# Patient Record
Sex: Female | Born: 1985
Health system: Southern US, Community
[De-identification: ages and names within clinical notes are randomized; demographics above are authoritative.]

## PROBLEM LIST (undated history)

## (undated) DIAGNOSIS — F419 Anxiety disorder, unspecified: Secondary | ICD-10-CM

## (undated) HISTORY — PX: DILATION AND CURETTAGE OF UTERUS: SHX78

---

## 2010-10-27 ENCOUNTER — Emergency Department (HOSPITAL_BASED_OUTPATIENT_CLINIC_OR_DEPARTMENT_OTHER)
Admission: EM | Admit: 2010-10-27 | Discharge: 2010-10-27 | Disposition: A | Discharge status: Home / Self Care | Payer: Self-pay | Attending: Emergency Medicine | Admitting: Emergency Medicine

## 2010-10-27 DIAGNOSIS — R300 Dysuria: Secondary | ICD-10-CM | POA: Insufficient documentation

## 2010-10-27 DIAGNOSIS — N390 Urinary tract infection, site not specified: Secondary | ICD-10-CM | POA: Insufficient documentation

## 2010-10-27 LAB — URINE MICROSCOPIC-ADD ON

## 2010-10-27 LAB — URINALYSIS, ROUTINE W REFLEX MICROSCOPIC
Nitrite: POSITIVE — AB
Protein, ur: NEGATIVE mg/dL
Specific Gravity, Urine: 1.006 (ref 1.005–1.030)
Urobilinogen, UA: 1 mg/dL (ref 0.0–1.0)

## 2010-10-29 LAB — URINE CULTURE: Colony Count: 70000

## 2011-05-24 ENCOUNTER — Emergency Department (INDEPENDENT_AMBULATORY_CARE_PROVIDER_SITE_OTHER): Payer: Self-pay

## 2011-05-24 ENCOUNTER — Encounter: Payer: Self-pay | Admitting: *Deleted

## 2011-05-24 ENCOUNTER — Emergency Department (HOSPITAL_BASED_OUTPATIENT_CLINIC_OR_DEPARTMENT_OTHER)
Admission: EM | Admit: 2011-05-24 | Discharge: 2011-05-25 | Disposition: A | Discharge status: Home / Self Care | Payer: Self-pay | Attending: Emergency Medicine | Admitting: Emergency Medicine

## 2011-05-24 DIAGNOSIS — B3731 Acute candidiasis of vulva and vagina: Secondary | ICD-10-CM | POA: Insufficient documentation

## 2011-05-24 DIAGNOSIS — K5289 Other specified noninfective gastroenteritis and colitis: Secondary | ICD-10-CM | POA: Insufficient documentation

## 2011-05-24 DIAGNOSIS — B373 Candidiasis of vulva and vagina: Secondary | ICD-10-CM

## 2011-05-24 DIAGNOSIS — K529 Noninfective gastroenteritis and colitis, unspecified: Secondary | ICD-10-CM

## 2011-05-24 DIAGNOSIS — F17200 Nicotine dependence, unspecified, uncomplicated: Secondary | ICD-10-CM | POA: Insufficient documentation

## 2011-05-24 DIAGNOSIS — R1013 Epigastric pain: Secondary | ICD-10-CM | POA: Insufficient documentation

## 2011-05-24 LAB — COMPREHENSIVE METABOLIC PANEL
AST: 21 U/L (ref 0–37)
Albumin: 3.9 g/dL (ref 3.5–5.2)
BUN: 9 mg/dL (ref 6–23)
Chloride: 106 mEq/L (ref 96–112)
Creatinine, Ser: 0.8 mg/dL (ref 0.50–1.10)
Potassium: 3.6 mEq/L (ref 3.5–5.1)
Total Bilirubin: 0.2 mg/dL — ABNORMAL LOW (ref 0.3–1.2)
Total Protein: 6.5 g/dL (ref 6.0–8.3)

## 2011-05-24 LAB — CBC
Hemoglobin: 13.7 g/dL (ref 12.0–15.0)
MCH: 31.9 pg (ref 26.0–34.0)
MCHC: 34.3 g/dL (ref 30.0–36.0)
RDW: 12.4 % (ref 11.5–15.5)

## 2011-05-24 LAB — URINALYSIS, ROUTINE W REFLEX MICROSCOPIC
Bilirubin Urine: NEGATIVE
Ketones, ur: NEGATIVE mg/dL
Leukocytes, UA: NEGATIVE
Nitrite: NEGATIVE
Protein, ur: NEGATIVE mg/dL

## 2011-05-24 LAB — DIFFERENTIAL
Basophils Absolute: 0 10*3/uL (ref 0.0–0.1)
Basophils Relative: 0 % (ref 0–1)
Eosinophils Absolute: 0.1 10*3/uL (ref 0.0–0.7)
Monocytes Absolute: 0.4 10*3/uL (ref 0.1–1.0)
Monocytes Relative: 7 % (ref 3–12)
Neutro Abs: 4.7 10*3/uL (ref 1.7–7.7)
Neutrophils Relative %: 70 % (ref 43–77)

## 2011-05-24 LAB — PREGNANCY, URINE: Preg Test, Ur: NEGATIVE

## 2011-05-24 LAB — LIPASE, BLOOD: Lipase: 38 U/L (ref 11–59)

## 2011-05-24 MED ORDER — ONDANSETRON HCL 4 MG/2ML IJ SOLN
4.0000 mg | Freq: Once | INTRAMUSCULAR | Status: AC
  Administered 2011-05-24: 4 mg via INTRAVENOUS
  Filled 2011-05-24: qty 2

## 2011-05-24 MED ORDER — SODIUM CHLORIDE 0.9 % IV SOLN
Freq: Once | INTRAVENOUS | Status: AC
  Administered 2011-05-24: 21:00:00 via INTRAVENOUS

## 2011-05-24 MED ORDER — HYDROMORPHONE HCL 1 MG/ML IJ SOLN
1.0000 mg | Freq: Once | INTRAMUSCULAR | Status: AC
  Administered 2011-05-24: 1 mg via INTRAVENOUS
  Filled 2011-05-24: qty 1

## 2011-05-24 NOTE — ED Notes (Addendum)
Pt reports constant dull pain in upper abd with occasional sharp pains. Pt reports decreased appetite on sat with abd painh starting on sun and diarrhea. Denies n/v. Pt also states she had abortion sept 9th and has not had period since.

## 2011-05-24 NOTE — ED Notes (Signed)
Pt c/o abd pain x 2 days , denies n/v/d

## 2011-05-24 NOTE — ED Provider Notes (Signed)
Medical screening examination/treatment/procedure(s) were performed by non-physician practitioner and as supervising physician I was immediately available for consultation/collaboration.   Lyanne Co, MD 05/24/11 [phone]

## 2011-05-24 NOTE — ED Provider Notes (Addendum)
History     CSN: [id number] Arrival date & time: 05/24/2011  7:57 PM  Chief Complaint  Patient presents with  . Abdominal Pain    (Consider location/radiation/quality/duration/timing/severity/associated sxs/prior treatment) Patient is a 25 y.o. female presenting with abdominal pain. The history is provided by the patient. No language interpreter was used.  Abdominal Pain The primary symptoms of the illness include abdominal pain. The current episode started 2 days ago. The onset of the illness was gradual. The problem has been gradually worsening.  The patient states that she believes she is currently not pregnant. The patient has not had a change in bowel habit. Additional symptoms associated with the illness include back pain. Significant associated medical issues include PUD and GERD.  Pt complains of right and left upper abdominal pain.  Pt has a heating pad that she reports helps with the pain.  History reviewed. No pertinent past medical history.  Past Surgical History  Procedure Date  . Dilation and curettage of uterus     History reviewed. No pertinent family history.  History  Substance Use Topics  . Smoking status: Current Everyday Smoker -- 0.5 packs/day  . Smokeless tobacco: Not on file  . Alcohol Use: No    OB History    Grav Para Term Preterm Abortions TAB SAB Ect Mult Living                  Review of Systems  Gastrointestinal: Positive for abdominal pain.  Musculoskeletal: Positive for back pain.  All other systems reviewed and are negative.    Allergies  Amoxicillin and Penicillins cross reactors  Home Medications   Current Outpatient Rx  Name Route Sig Dispense Refill  . IBUPROFEN 200 MG PO TABS Oral Take 1,200 mg by mouth every 6 (six) hours as needed. For pain     . RANITIDINE HCL 150 MG PO TABS Oral Take 150 mg by mouth 2 (two) times daily.        BP 135/81  Pulse 104  Temp(Src) 98.7 F (37.1 C) (Oral)  Resp 16  Ht 5\' 2"  (1.575 m)   Wt 120 lb (54.432 kg)  BMI 21.95 kg/m2  SpO2 100%  LMP 03/25/2011  Physical Exam  Nursing note and vitals reviewed. Constitutional: She is oriented to person, place, and time. She appears well-developed and well-nourished.  HENT:  Head: Normocephalic and atraumatic.  Eyes: Conjunctivae are normal. Pupils are equal, round, and reactive to light.  Neck: Normal range of motion.  Cardiovascular: Normal rate.   Pulmonary/Chest: Effort normal.  Abdominal: Soft. There is tenderness.  Genitourinary: Vaginal discharge found.       Thick white discharge looks like yeast,  Adnexa and cervix nontender  Musculoskeletal: Normal range of motion.  Neurological: She is alert and oriented to person, place, and time.  Skin: Skin is warm.  Psychiatric: She has a normal mood and affect.    ED Course  Procedures (including critical care time)   Labs Reviewed  PREGNANCY, URINE  URINALYSIS, ROUTINE W REFLEX MICROSCOPIC  CBC  DIFFERENTIAL  COMPREHENSIVE METABOLIC PANEL  LIPASE, BLOOD   No results found.   No diagnosis found.    MDM   Pt counseled on results,  Pt given rx for yeast and rx for pain      Langston Masker, [place] 05/24/11 2337  Langston Masker, [place] 05/29/11 1719

## 2011-05-24 NOTE — ED Notes (Signed)
Pt assisted to bathroom. Pt denies pain at this time. Pt awaiting ct scan. Pt made aware of POC.

## 2011-05-24 NOTE — ED Notes (Signed)
Abortion x 9 weeks ago

## 2011-05-25 DIAGNOSIS — R109 Unspecified abdominal pain: Secondary | ICD-10-CM

## 2011-05-25 LAB — WET PREP, GENITAL
Clue Cells Wet Prep HPF POC: NONE SEEN
Trich, Wet Prep: NONE SEEN

## 2011-05-25 MED ORDER — METRONIDAZOLE 500 MG PO TABS
500.0000 mg | ORAL_TABLET | Freq: Once | ORAL | Status: AC
  Administered 2011-05-25: 500 mg via ORAL
  Filled 2011-05-25: qty 1

## 2011-05-25 MED ORDER — CIPROFLOXACIN HCL 500 MG PO TABS: 500.0000 mg | ORAL_TABLET | Freq: Two times a day (BID) | ORAL | Status: DC

## 2011-05-25 MED ORDER — HYDROCODONE-ACETAMINOPHEN 5-325 MG PO TABS: 2.0000 | ORAL_TABLET | ORAL | Status: AC | PRN

## 2011-05-25 MED ORDER — CIPROFLOXACIN HCL 500 MG PO TABS: 500.0000 mg | ORAL_TABLET | Freq: Two times a day (BID) | ORAL | Status: AC

## 2011-05-25 MED ORDER — IOHEXOL 300 MG/ML  SOLN
100.0000 mL | Freq: Once | INTRAMUSCULAR | Status: AC | PRN
  Administered 2011-05-25: 100 mL via INTRAVENOUS

## 2011-05-25 MED ORDER — FLUCONAZOLE 200 MG PO TABS: 150.0000 mg | ORAL_TABLET | Freq: Every day | ORAL | Status: AC

## 2011-05-25 MED ORDER — CIPROFLOXACIN HCL 500 MG PO TABS
500.0000 mg | ORAL_TABLET | Freq: Once | ORAL | Status: AC
  Administered 2011-05-25: 500 mg via ORAL
  Filled 2011-05-25: qty 1

## 2011-05-25 MED ORDER — METRONIDAZOLE 500 MG PO TABS: 500.0000 mg | ORAL_TABLET | Freq: Two times a day (BID) | ORAL | Status: AC

## 2011-05-25 MED ORDER — OMEPRAZOLE 20 MG PO CPDR: 20.0000 mg | DELAYED_RELEASE_CAPSULE | Freq: Every day | ORAL | Status: DC

## 2011-05-26 LAB — GC/CHLAMYDIA PROBE AMP, GENITAL: GC Probe Amp, Genital: NEGATIVE

## 2011-06-07 NOTE — ED Provider Notes (Signed)
Medical screening examination/treatment/procedure(s) were performed by non-physician practitioner and as supervising physician I was immediately available for consultation/collaboration.   Lyanne Co, MD 06/07/11 [phone]

## 2011-09-07 ENCOUNTER — Emergency Department (INDEPENDENT_AMBULATORY_CARE_PROVIDER_SITE_OTHER): Payer: Self-pay

## 2011-09-07 ENCOUNTER — Encounter (HOSPITAL_BASED_OUTPATIENT_CLINIC_OR_DEPARTMENT_OTHER): Payer: Self-pay

## 2011-09-07 ENCOUNTER — Emergency Department (HOSPITAL_BASED_OUTPATIENT_CLINIC_OR_DEPARTMENT_OTHER)
Admission: EM | Admit: 2011-09-07 | Discharge: 2011-09-07 | Disposition: A | Discharge status: Home / Self Care | Payer: Self-pay | Attending: Emergency Medicine | Admitting: Emergency Medicine

## 2011-09-07 DIAGNOSIS — R109 Unspecified abdominal pain: Secondary | ICD-10-CM

## 2011-09-07 DIAGNOSIS — N912 Amenorrhea, unspecified: Secondary | ICD-10-CM | POA: Insufficient documentation

## 2011-09-07 DIAGNOSIS — G43909 Migraine, unspecified, not intractable, without status migrainosus: Secondary | ICD-10-CM | POA: Insufficient documentation

## 2011-09-07 LAB — URINALYSIS, ROUTINE W REFLEX MICROSCOPIC
Bilirubin Urine: NEGATIVE
Glucose, UA: NEGATIVE mg/dL
Hgb urine dipstick: NEGATIVE
Ketones, ur: NEGATIVE mg/dL
Protein, ur: NEGATIVE mg/dL
Urobilinogen, UA: 0.2 mg/dL (ref 0.0–1.0)

## 2011-09-07 LAB — WET PREP, GENITAL
Trich, Wet Prep: NONE SEEN
Yeast Wet Prep HPF POC: NONE SEEN

## 2011-09-07 LAB — PREGNANCY, URINE: Preg Test, Ur: NEGATIVE

## 2011-09-07 MED ORDER — HYDROCODONE-ACETAMINOPHEN 5-500 MG PO TABS: 1.0000 | ORAL_TABLET | Freq: Four times a day (QID) | ORAL | Status: AC | PRN

## 2011-09-07 NOTE — ED Notes (Signed)
Pt c/o no menstrual cycle since abortion on 03/25/11.  Pt states she has intermittent pressure in lower abdomen.  Pt denies N/V.  Pt states sometimes pain increases after eating.

## 2011-09-07 NOTE — ED Provider Notes (Signed)
History     CSN: [id number]  Arrival date & time 09/07/11  1710   First MD Initiated Contact with Patient 09/07/11 1733      Chief Complaint  Patient presents with  . Amenorrhea    (Consider location/radiation/quality/duration/timing/severity/associated sxs/prior treatment) HPI Comments: Pt states that she hasn't had a period since 03/25/11 when she had an abortion:pt state that she gets the pain every couple of days and it lasts a couple of days:pt states that she seems to be getting more migraines with all this as well and she treats with excedrin  Patient is a 26 y.o. female presenting with abdominal pain. The history is provided by the patient. No language interpreter was used.  Abdominal Pain The primary symptoms of the illness include abdominal pain. The primary symptoms of the illness do not include fever, nausea, vomiting, dysuria, vaginal discharge or vaginal bleeding. The current episode started more than 2 days ago. The onset of the illness was gradual. The problem has not changed since onset. The patient states that she believes she is currently not pregnant. The patient has not had a change in bowel habit. Symptoms associated with the illness do not include constipation, urgency, hematuria or frequency.    History reviewed. No pertinent past medical history.  Past Surgical History  Procedure Date  . Dilation and curettage of uterus     No family history on file.  History  Substance Use Topics  . Smoking status: Current Everyday Smoker -- 0.5 packs/day  . Smokeless tobacco: Not on file  . Alcohol Use: No    OB History    Grav Para Term Preterm Abortions TAB SAB Ect Mult Living                  Review of Systems  Constitutional: Negative for fever.  Gastrointestinal: Positive for abdominal pain. Negative for nausea, vomiting and constipation.  Genitourinary: Negative for dysuria, urgency, frequency, hematuria, vaginal bleeding and vaginal discharge.  All  other systems reviewed and are negative.    Allergies  Amoxicillin and Penicillins cross reactors  Home Medications   Current Outpatient Rx  Name Route Sig Dispense Refill  . ASPIRIN-ACETAMINOPHEN-CAFFEINE 250-250-65 MG PO TABS Oral Take 3 tablets by mouth every 6 (six) hours as needed. For pain    . ASPIRIN-ACETAMINOPHEN-CAFFEINE 500-325-65 MG PO PACK Oral Take 1 packet by mouth every 6 (six) hours as needed. For pain    . IBUPROFEN 200 MG PO TABS Oral Take 1,200 mg by mouth every 6 (six) hours as needed. For pain      BP 120/84  Pulse 98  Temp(Src) 98.4 F (36.9 C) (Oral)  Resp 16  Ht 5\' 3"  (1.6 m)  Wt 120 lb (54.432 kg)  BMI 21.26 kg/m2  SpO2 100%  LMP 01/10/2011  Physical Exam  Nursing note and vitals reviewed. Constitutional: She is oriented to person, place, and time. She appears well-developed and well-nourished.  HENT:  Head: Normocephalic and atraumatic.  Cardiovascular: Normal rate and regular rhythm.   Pulmonary/Chest: Effort normal and breath sounds normal.  Abdominal: Soft. Bowel sounds are normal. There is no tenderness.  Genitourinary: Cervix exhibits motion tenderness. Vaginal discharge found.  Musculoskeletal: Normal range of motion.  Neurological: She is alert and oriented to person, place, and time.  Skin: Skin is warm and dry.  Psychiatric: She has a normal mood and affect.    ED Course  Procedures (including critical care time)  Labs Reviewed  WET PREP, GENITAL - Abnormal;  Notable for the following:    Clue Cells, Wet Prep FEW (*)    WBC, Wet Prep HPF POC FEW (*)    All other components within normal limits  URINALYSIS, ROUTINE W REFLEX MICROSCOPIC  PREGNANCY, URINE  GC/CHLAMYDIA PROBE AMP, GENITAL   US Transvaginal Non-ob  09/07/2011  *RADIOLOGY REPORT*  Clinical Data: Abdominal pain.  Pressure.  No menses since [facility] in August 2012.  LMP 03/25/2011.  TRANSABDOMINAL AND TRANSVAGINAL ULTRASOUND OF PELVIS Technique:  Both transabdominal and  transvaginal ultrasound examinations of the pelvis were performed. Transabdominal technique was performed for global imaging of the pelvis including uterus, ovaries, adnexal regions, and pelvic cul-de-sac.  Comparison: CT 05/25/2011   It was necessary to proceed with endovaginal exam following the transabdominal exam to visualize the uterus and ovaries.  Findings:  Uterus: The uterus is 5.8 x 3.0 x 4.2 cm.  Endometrium: The endometrium is normal in appearance, 4.9 mm.  Right ovary:  Normal appearance/no adnexal mass  Left ovary: Normal appearance/no adnexal mass  Other findings: No free fluid  IMPRESSION: Normal study. No evidence of pelvic mass or other significant abnormality.  Original Report Authenticated By: Patterson Hammersmith, M.D.   US Pelvis Complete  09/07/2011  *RADIOLOGY REPORT*  Clinical Data: Abdominal pain.  Pressure.  No menses since [facility] in August 2012.  LMP 03/25/2011.  TRANSABDOMINAL AND TRANSVAGINAL ULTRASOUND OF PELVIS Technique:  Both transabdominal and transvaginal ultrasound examinations of the pelvis were performed. Transabdominal technique was performed for global imaging of the pelvis including uterus, ovaries, adnexal regions, and pelvic cul-de-sac.  Comparison: CT 05/25/2011   It was necessary to proceed with endovaginal exam following the transabdominal exam to visualize the uterus and ovaries.  Findings:  Uterus: The uterus is 5.8 x 3.0 x 4.2 cm.  Endometrium: The endometrium is normal in appearance, 4.9 mm.  Right ovary:  Normal appearance/no adnexal mass  Left ovary: Normal appearance/no adnexal mass  Other findings: No free fluid  IMPRESSION: Normal study. No evidence of pelvic mass or other significant abnormality.  Original Report Authenticated By: Patterson Hammersmith, M.D.     1. Amenorrhea   2. Abdominal pain       MDM  No reason noted for pts symptoms:pt is okay to follow up for continued symptom        Teressa Lower, NP 09/07/11 1944

## 2011-09-08 NOTE — ED Provider Notes (Signed)
Medical screening examination/treatment/procedure(s) were performed by non-physician practitioner and as supervising physician I was immediately available for consultation/collaboration.   Landin Tallon A. Keigo Whalley, MD 09/08/11 0007 

## 2012-07-05 ENCOUNTER — Encounter (HOSPITAL_BASED_OUTPATIENT_CLINIC_OR_DEPARTMENT_OTHER): Payer: Self-pay | Admitting: *Deleted

## 2012-07-05 ENCOUNTER — Emergency Department (HOSPITAL_BASED_OUTPATIENT_CLINIC_OR_DEPARTMENT_OTHER)
Admission: EM | Admit: 2012-07-05 | Discharge: 2012-07-05 | Disposition: A | Discharge status: Home / Self Care | Payer: Self-pay | Attending: Emergency Medicine | Admitting: Emergency Medicine

## 2012-07-05 DIAGNOSIS — R509 Fever, unspecified: Secondary | ICD-10-CM | POA: Insufficient documentation

## 2012-07-05 DIAGNOSIS — F17200 Nicotine dependence, unspecified, uncomplicated: Secondary | ICD-10-CM | POA: Insufficient documentation

## 2012-07-05 DIAGNOSIS — R059 Cough, unspecified: Secondary | ICD-10-CM | POA: Insufficient documentation

## 2012-07-05 DIAGNOSIS — R05 Cough: Secondary | ICD-10-CM | POA: Insufficient documentation

## 2012-07-05 DIAGNOSIS — J329 Chronic sinusitis, unspecified: Secondary | ICD-10-CM | POA: Insufficient documentation

## 2012-07-05 MED ORDER — SULFAMETHOXAZOLE-TRIMETHOPRIM 800-160 MG PO TABS: 1.0000 | ORAL_TABLET | Freq: Two times a day (BID) | ORAL | Status: DC

## 2012-07-05 MED ORDER — HYDROCODONE-ACETAMINOPHEN 5-325 MG PO TABS: 2.0000 | ORAL_TABLET | ORAL | Status: DC | PRN

## 2012-07-05 NOTE — ED Notes (Signed)
PA at bedside.

## 2012-07-05 NOTE — ED Provider Notes (Signed)
Medical screening examination/treatment/procedure(s) were performed by non-physician practitioner and as supervising physician I was immediately available for consultation/collaboration.   Charles B. Sheldon, MD 07/05/12 2037 

## 2012-07-05 NOTE — ED Notes (Signed)
Pt c/o " h/a all day"  

## 2012-07-05 NOTE — ED Provider Notes (Signed)
History     CSN: [id number]  Arrival date & time 07/05/12  4098   First MD Initiated Contact with Patient 07/05/12 1933      Chief Complaint  Patient presents with  . Headache    (Consider location/radiation/quality/duration/timing/severity/associated sxs/prior treatment) Patient is a 26 y.o. female presenting with headaches. The history is provided by the patient. No language interpreter was used.  Headache  This is a new problem. The current episode started 12 to 24 hours ago. The problem occurs constantly. The problem has been gradually worsening. Associated with: cough. The pain is located in the bilateral region. The quality of the pain is described as sharp. The pain is at a severity of 6/10. The pain does not radiate. Associated symptoms include a fever. Pertinent negatives include no nausea and no vomiting. She has tried NSAIDs for the symptoms.    History reviewed. No pertinent past medical history.  Past Surgical History  Procedure Date  . Dilation and curettage of uterus     History reviewed. No pertinent family history.  History  Substance Use Topics  . Smoking status: Current Every Day Smoker -- 0.5 packs/day  . Smokeless tobacco: Not on file  . Alcohol Use: No    OB History    Grav Para Term Preterm Abortions TAB SAB Ect Mult Living                  Review of Systems  Constitutional: Positive for fever.  Gastrointestinal: Negative for nausea and vomiting.  Neurological: Positive for headaches.  All other systems reviewed and are negative.    Allergies  Amoxicillin and Penicillins cross reactors  Home Medications   Current Outpatient Rx  Name  Route  Sig  Dispense  Refill  . ASPIRIN-ACETAMINOPHEN-CAFFEINE 250-250-65 MG PO TABS   Oral   Take 3 tablets by mouth every 6 (six) hours as needed. For pain         . ASPIRIN-ACETAMINOPHEN-CAFFEINE 500-325-65 MG PO PACK   Oral   Take 1 packet by mouth every 6 (six) hours as needed. For pain        . IBUPROFEN 200 MG PO TABS   Oral   Take 1,200 mg by mouth every 6 (six) hours as needed. For pain           BP 100/78  Pulse 75  Temp 98 F (36.7 C) (Oral)  Resp 16  Ht 5\' 2"  (1.575 m)  Wt 130 lb (58.968 kg)  BMI 23.78 kg/m2  SpO2 100%  LMP 07/05/2012  Physical Exam  Nursing note and vitals reviewed. Constitutional: She appears well-developed and well-nourished.  HENT:  Head: Normocephalic and atraumatic.  Right Ear: External ear normal.  Left Ear: External ear normal.  Nose: Nose normal.  Mouth/Throat: Oropharynx is clear and moist.       Tender bilat maxillary sinuses and frontal sinuses  Eyes: Conjunctivae normal and EOM are normal. Pupils are equal, round, and reactive to light.  Neck: Normal range of motion. Neck supple.  Cardiovascular: Normal rate and normal heart sounds.   Pulmonary/Chest: Effort normal.  Abdominal: Soft.  Musculoskeletal: Normal range of motion.  Neurological: She is alert.  Skin: Skin is warm.  Psychiatric: She has a normal mood and affect.    ED Course  Procedures (including critical care time)  Labs Reviewed - No data to display No results found.   No diagnosis found.    MDM  Hydrocodone #10 Bactrim DS  Lonia Skinner [place], [place] 07/05/12 2002

## 2012-08-15 ENCOUNTER — Encounter (HOSPITAL_BASED_OUTPATIENT_CLINIC_OR_DEPARTMENT_OTHER): Payer: Self-pay | Admitting: *Deleted

## 2012-08-15 ENCOUNTER — Emergency Department (HOSPITAL_BASED_OUTPATIENT_CLINIC_OR_DEPARTMENT_OTHER)
Admission: EM | Admit: 2012-08-15 | Discharge: 2012-08-15 | Disposition: A | Discharge status: Home / Self Care | Payer: Self-pay | Attending: Emergency Medicine | Admitting: Emergency Medicine

## 2012-08-15 DIAGNOSIS — N390 Urinary tract infection, site not specified: Secondary | ICD-10-CM | POA: Insufficient documentation

## 2012-08-15 DIAGNOSIS — Z79899 Other long term (current) drug therapy: Secondary | ICD-10-CM | POA: Insufficient documentation

## 2012-08-15 DIAGNOSIS — R110 Nausea: Secondary | ICD-10-CM | POA: Insufficient documentation

## 2012-08-15 DIAGNOSIS — Z87440 Personal history of urinary (tract) infections: Secondary | ICD-10-CM | POA: Insufficient documentation

## 2012-08-15 DIAGNOSIS — R350 Frequency of micturition: Secondary | ICD-10-CM | POA: Insufficient documentation

## 2012-08-15 DIAGNOSIS — F17200 Nicotine dependence, unspecified, uncomplicated: Secondary | ICD-10-CM | POA: Insufficient documentation

## 2012-08-15 LAB — URINALYSIS, ROUTINE W REFLEX MICROSCOPIC
Hgb urine dipstick: NEGATIVE
Ketones, ur: 15 mg/dL — AB
Specific Gravity, Urine: 1.023 (ref 1.005–1.030)
Urobilinogen, UA: 4 mg/dL — ABNORMAL HIGH (ref 0.0–1.0)
pH: 5 (ref 5.0–8.0)

## 2012-08-15 LAB — URINE MICROSCOPIC-ADD ON

## 2012-08-15 MED ORDER — HYDROCODONE-ACETAMINOPHEN 5-325 MG PO TABS: 2.0000 | ORAL_TABLET | ORAL | Status: DC | PRN

## 2012-08-15 MED ORDER — SULFAMETHOXAZOLE-TRIMETHOPRIM 800-160 MG PO TABS: 1.0000 | ORAL_TABLET | Freq: Two times a day (BID) | ORAL | Status: DC

## 2012-08-15 NOTE — ED Provider Notes (Signed)
History     CSN: [id number]  Arrival date & time 08/15/12  1517   First MD Initiated Contact with Patient 08/15/12 1656      Chief Complaint  Patient presents with  . Urinary Tract Infection    (Consider location/radiation/quality/duration/timing/severity/associated sxs/prior treatment) Patient is a 26 y.o. female presenting with dysuria. The history is provided by the patient. No language interpreter was used.  Dysuria  This is a new problem. The current episode started yesterday. The problem occurs every urination. The problem has been gradually worsening. The quality of the pain is described as burning. The pain is at a severity of 7/10. The pain is moderate. There has been no fever. Associated symptoms include nausea and frequency. Pertinent negatives include no chills. Treatments tried: azo. Her past medical history is significant for recurrent UTIs.    History reviewed. No pertinent past medical history.  Past Surgical History  Procedure Date  . Dilation and curettage of uterus     History reviewed. No pertinent family history.  History  Substance Use Topics  . Smoking status: Current Every Day Smoker -- 0.5 packs/day  . Smokeless tobacco: Not on file  . Alcohol Use: No    OB History    Grav Para Term Preterm Abortions TAB SAB Ect Mult Living                  Review of Systems  Constitutional: Negative for chills.  Gastrointestinal: Positive for nausea.  Genitourinary: Positive for dysuria and frequency.  All other systems reviewed and are negative.    Allergies  Amoxicillin and Penicillins cross reactors  Home Medications   Current Outpatient Rx  Name  Route  Sig  Dispense  Refill  . ASPIRIN-ACETAMINOPHEN-CAFFEINE 250-250-65 MG PO TABS   Oral   Take 3 tablets by mouth every 6 (six) hours as needed. For pain         . ASPIRIN-ACETAMINOPHEN-CAFFEINE 500-325-65 MG PO PACK   Oral   Take 1 packet by mouth every 6 (six) hours as needed. For pain           . HYDROCODONE-ACETAMINOPHEN 5-325 MG PO TABS   Oral   Take 2 tablets by mouth every 4 (four) hours as needed for pain.   10 tablet   0   . IBUPROFEN 200 MG PO TABS   Oral   Take 1,200 mg by mouth every 6 (six) hours as needed. For pain         . SULFAMETHOXAZOLE-TRIMETHOPRIM 800-160 MG PO TABS   Oral   Take 1 tablet by mouth every 12 (twelve) hours.   14 tablet   0     BP 110/69  Pulse 87  Temp 98.5 F (36.9 C) (Oral)  Resp 16  SpO2 99%  LMP 08/08/2012  Physical Exam  Nursing note and vitals reviewed. Constitutional: She appears well-developed and well-nourished.  HENT:  Head: Normocephalic.  Right Ear: External ear normal.  Left Ear: External ear normal.  Eyes: Conjunctivae normal are normal. Pupils are equal, round, and reactive to light.  Neck: Normal range of motion. Neck supple.  Cardiovascular: Normal rate.   Pulmonary/Chest: Effort normal.  Abdominal: Soft.  Musculoskeletal: Normal range of motion.  Neurological: She is alert.  Skin: Skin is warm.    ED Course  Procedures (including critical care time)  Labs Reviewed  URINALYSIS, ROUTINE W REFLEX MICROSCOPIC - Abnormal; Notable for the following:    Color, Urine RED (*)  BIOCHEMICALS MAY BE AFFECTED  BY COLOR   APPearance CLOUDY (*)     Bilirubin Urine SMALL (*)     Ketones, ur 15 (*)     Urobilinogen, UA 4.0 (*)     Nitrite POSITIVE (*)     Leukocytes, UA MODERATE (*)     All other components within normal limits  URINE MICROSCOPIC-ADD ON - Abnormal; Notable for the following:    Squamous Epithelial / LPF FEW (*)     Bacteria, UA FEW (*)     All other components within normal limits  URINE CULTURE   No results found.   No diagnosis found.    MDM  Pt given rx for bactrim and hydrocodone        Lonia Skinner [place], [place] 08/15/12 1704

## 2012-08-15 NOTE — ED Notes (Signed)
Pt with hx of frequent UTI states that last night began having sx of frequent burning urination states that she is taking OTC azo for sx. Denies fever has had nausea

## 2012-08-16 NOTE — ED Provider Notes (Signed)
History/physical exam/procedure(s) were performed by non-physician practitioner and as supervising physician I was immediately available for consultation/collaboration. I have reviewed all notes and am in agreement with care and plan.   Hilario Quarry, MD 08/16/12 [phone]

## 2012-08-17 LAB — URINE CULTURE: Colony Count: 15000

## 2012-08-18 NOTE — ED Notes (Signed)
+  urine Patient treated with Septra-sensitive to same-chart appended per protocol MD. 

## 2013-02-02 ENCOUNTER — Emergency Department (HOSPITAL_BASED_OUTPATIENT_CLINIC_OR_DEPARTMENT_OTHER)
Admission: EM | Admit: 2013-02-02 | Discharge: 2013-02-02 | Disposition: A | Discharge status: Home / Self Care | Payer: No Typology Code available for payment source | Attending: Emergency Medicine | Admitting: Emergency Medicine

## 2013-02-02 ENCOUNTER — Encounter (HOSPITAL_BASED_OUTPATIENT_CLINIC_OR_DEPARTMENT_OTHER): Payer: Self-pay | Admitting: *Deleted

## 2013-02-02 DIAGNOSIS — S0990XA Unspecified injury of head, initial encounter: Secondary | ICD-10-CM | POA: Insufficient documentation

## 2013-02-02 DIAGNOSIS — F17200 Nicotine dependence, unspecified, uncomplicated: Secondary | ICD-10-CM | POA: Insufficient documentation

## 2013-02-02 DIAGNOSIS — Z880 Allergy status to penicillin: Secondary | ICD-10-CM | POA: Insufficient documentation

## 2013-02-02 DIAGNOSIS — S161XXA Strain of muscle, fascia and tendon at neck level, initial encounter: Secondary | ICD-10-CM

## 2013-02-02 DIAGNOSIS — Z792 Long term (current) use of antibiotics: Secondary | ICD-10-CM | POA: Insufficient documentation

## 2013-02-02 DIAGNOSIS — Y92410 Unspecified street and highway as the place of occurrence of the external cause: Secondary | ICD-10-CM | POA: Insufficient documentation

## 2013-02-02 DIAGNOSIS — Y9389 Activity, other specified: Secondary | ICD-10-CM | POA: Insufficient documentation

## 2013-02-02 DIAGNOSIS — R55 Syncope and collapse: Secondary | ICD-10-CM | POA: Insufficient documentation

## 2013-02-02 DIAGNOSIS — S139XXA Sprain of joints and ligaments of unspecified parts of neck, initial encounter: Secondary | ICD-10-CM | POA: Insufficient documentation

## 2013-02-02 DIAGNOSIS — IMO0002 Reserved for concepts with insufficient information to code with codable children: Secondary | ICD-10-CM | POA: Insufficient documentation

## 2013-02-02 MED ORDER — METHOCARBAMOL 500 MG PO TABS
500.0000 mg | ORAL_TABLET | Freq: Once | ORAL | Status: AC
  Administered 2013-02-02: 500 mg via ORAL
  Filled 2013-02-02: qty 1

## 2013-02-02 MED ORDER — IBUPROFEN 600 MG PO TABS: 600.0000 mg | ORAL_TABLET | Freq: Four times a day (QID) | ORAL | Status: DC | PRN

## 2013-02-02 MED ORDER — TRAMADOL HCL 50 MG PO TABS: 50.0000 mg | ORAL_TABLET | Freq: Four times a day (QID) | ORAL | Status: DC | PRN

## 2013-02-02 MED ORDER — METHOCARBAMOL 500 MG PO TABS: 500.0000 mg | ORAL_TABLET | Freq: Two times a day (BID) | ORAL | Status: DC

## 2013-02-02 MED ORDER — KETOROLAC TROMETHAMINE 60 MG/2ML IM SOLN
60.0000 mg | Freq: Once | INTRAMUSCULAR | Status: AC
  Administered 2013-02-02: 60 mg via INTRAMUSCULAR
  Filled 2013-02-02: qty 2

## 2013-02-02 MED ORDER — TRAMADOL HCL 50 MG PO TABS
50.0000 mg | ORAL_TABLET | Freq: Once | ORAL | Status: AC
  Administered 2013-02-02: 50 mg via ORAL
  Filled 2013-02-02: qty 1
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
## 2013-03-21 ENCOUNTER — Emergency Department (HOSPITAL_BASED_OUTPATIENT_CLINIC_OR_DEPARTMENT_OTHER): Payer: Self-pay

## 2013-03-21 ENCOUNTER — Encounter (HOSPITAL_BASED_OUTPATIENT_CLINIC_OR_DEPARTMENT_OTHER): Payer: Self-pay | Admitting: *Deleted

## 2013-03-21 ENCOUNTER — Emergency Department (HOSPITAL_BASED_OUTPATIENT_CLINIC_OR_DEPARTMENT_OTHER)
Admission: EM | Admit: 2013-03-21 | Discharge: 2013-03-21 | Disposition: A | Discharge status: Home / Self Care | Payer: Self-pay | Attending: Emergency Medicine | Admitting: Emergency Medicine

## 2013-03-21 DIAGNOSIS — R059 Cough, unspecified: Secondary | ICD-10-CM | POA: Insufficient documentation

## 2013-03-21 DIAGNOSIS — F17200 Nicotine dependence, unspecified, uncomplicated: Secondary | ICD-10-CM | POA: Insufficient documentation

## 2013-03-21 DIAGNOSIS — R6883 Chills (without fever): Secondary | ICD-10-CM | POA: Insufficient documentation

## 2013-03-21 DIAGNOSIS — Z3202 Encounter for pregnancy test, result negative: Secondary | ICD-10-CM | POA: Insufficient documentation

## 2013-03-21 DIAGNOSIS — R05 Cough: Secondary | ICD-10-CM | POA: Insufficient documentation

## 2013-03-21 DIAGNOSIS — N390 Urinary tract infection, site not specified: Secondary | ICD-10-CM | POA: Insufficient documentation

## 2013-03-21 DIAGNOSIS — J029 Acute pharyngitis, unspecified: Secondary | ICD-10-CM | POA: Insufficient documentation

## 2013-03-21 LAB — URINALYSIS, ROUTINE W REFLEX MICROSCOPIC
Glucose, UA: NEGATIVE mg/dL
Ketones, ur: 15 mg/dL — AB
Protein, ur: NEGATIVE mg/dL
pH: 7 (ref 5.0–8.0)

## 2013-03-21 LAB — URINE MICROSCOPIC-ADD ON

## 2013-03-21 MED ORDER — SULFAMETHOXAZOLE-TMP DS 800-160 MG PO TABS: 1.0000 | ORAL_TABLET | Freq: Two times a day (BID) | ORAL | Status: DC

## 2013-03-21 MED ORDER — SULFAMETHOXAZOLE-TMP DS 800-160 MG PO TABS
1.0000 | ORAL_TABLET | Freq: Once | ORAL | Status: AC
  Administered 2013-03-21: 1 via ORAL
  Filled 2013-03-21: qty 1

## 2013-03-21 NOTE — ED Provider Notes (Signed)
CSN: [id number]     Arrival date & time 03/21/13  1412 History     First MD Initiated Contact with Patient 03/21/13 1501     Chief Complaint  Patient presents with  . Dysuria  . Cough   (Consider location/radiation/quality/duration/timing/severity/associated sxs/prior Treatment) HPI Comments: The patient also complaining of sore throat, chills, cough for the past few days. Mild productivity to her cough. No shortness of breath.  Patient is a 27 y.o. female presenting with dysuria and cough. The history is provided by the patient.  Dysuria Pain quality:  Sharp Pain severity:  Moderate Onset quality:  Sudden Timing:  Constant Progression:  Unchanged Chronicity:  Recurrent Recent urinary tract infections: yes   Relieved by:  Nothing Worsened by:  Nothing tried Ineffective treatments:  None tried Urinary symptoms: foul-smelling urine   Urinary symptoms: no discolored urine   Associated symptoms: no abdominal pain, no fever, no nausea, no vaginal discharge and no vomiting   Cough Associated symptoms: no fever     History reviewed. No pertinent past medical history. Past Surgical History  Procedure Laterality Date  . Dilation and curettage of uterus     History reviewed. No pertinent family history. History  Substance Use Topics  . Smoking status: Current Every Day Smoker -- 1.00 packs/day    Types: Cigars  . Smokeless tobacco: Not on file  . Alcohol Use: No   OB History   Grav Para Term Preterm Abortions TAB SAB Ect Mult Living                 Review of Systems  Constitutional: Negative for fever.  Respiratory: Positive for cough.   Gastrointestinal: Negative for nausea, vomiting and abdominal pain.  Genitourinary: Positive for dysuria. Negative for vaginal bleeding, vaginal discharge and vaginal pain.  All other systems reviewed and are negative.    Allergies  Amoxicillin and Penicillins cross reactors  Home Medications   Current Outpatient Rx  Name  Route   Sig  Dispense  Refill  . aspirin-acetaminophen-caffeine (EXCEDRIN MIGRAINE) 250-250-65 MG per tablet   Oral   Take 3 tablets by mouth every 6 (six) hours as needed. For pain         . Aspirin-Acetaminophen-Caffeine (GOODYS EXTRA STRENGTH) 500-325-65 MG PACK   Oral   Take 1 packet by mouth every 6 (six) hours as needed. For pain         . ibuprofen (ADVIL,MOTRIN) 200 MG tablet   Oral   Take 1,200 mg by mouth every 6 (six) hours as needed. For pain         . ibuprofen (ADVIL,MOTRIN) 600 MG tablet   Oral   Take 1 tablet (600 mg total) by mouth every 6 (six) hours as needed for pain.   30 tablet   0   . traMADol (ULTRAM) 50 MG tablet   Oral   Take 1 tablet (50 mg total) by mouth every 6 (six) hours as needed for pain.   15 tablet   0    BP 116/79  Pulse 92  Temp(Src) 99.1 F (37.3 C) (Oral)  Resp 16  Ht 5\' 3"  (1.6 m)  Wt 125 lb (56.7 kg)  BMI 22.15 kg/m2  SpO2 100%  LMP 03/14/2013 Physical Exam  Nursing note and vitals reviewed. Constitutional: She is oriented to person, place, and time. She appears well-developed and well-nourished. No distress.  HENT:  Head: Normocephalic and atraumatic.  Eyes: EOM are normal. Pupils are equal, round, and reactive to light.  Neck: Normal range of motion. Neck supple.  Cardiovascular: Normal rate and regular rhythm.  Exam reveals no friction rub.   No murmur heard. Pulmonary/Chest: Effort normal and breath sounds normal. No respiratory distress. She has no wheezes. She has no rales.  Abdominal: Soft. She exhibits no distension. There is no tenderness. There is no rebound.  Musculoskeletal: Normal range of motion. She exhibits no edema.  Neurological: She is alert and oriented to person, place, and time.  Skin: She is not diaphoretic.    ED Course   Procedures (including critical care time)  Labs Reviewed  URINALYSIS, ROUTINE W REFLEX MICROSCOPIC - Abnormal; Notable for the following:    Color, Urine ORANGE (*)     APPearance CLOUDY (*)    Hgb urine dipstick SMALL (*)    Bilirubin Urine SMALL (*)    Ketones, ur 15 (*)    Urobilinogen, UA 2.0 (*)    Nitrite POSITIVE (*)    Leukocytes, UA LARGE (*)    All other components within normal limits  URINE MICROSCOPIC-ADD ON - Abnormal; Notable for the following:    Bacteria, UA MANY (*)    All other components within normal limits  URINE CULTURE  PREGNANCY, URINE   Dg Chest 2 View  03/21/2013   *RADIOLOGY REPORT*  Clinical Data: Cough and chills  CHEST - 2 VIEW  Comparison: None.  Findings:  Lungs clear.  The heart size and pulmonary vascularity are normal.  No adenopathy.  No bone lesions.  IMPRESSION: No abnormality noted.   Original Report Authenticated By: Bretta Bang, M.D.   1. UTI (urinary tract infection)     MDM   27 year old female presents with upper URI symptoms of cough, chills, sore throat and urinary symptoms. History of UTIs. Afebrile vitals are stable here. On exam she has mildly red oropharynx, normal TMs. No lymphadenopathy. Lungs are clear. Urine shows UTI. Will treat with Bactrim here. Will obtain chest x-ray. CXR normal. Stable for discharge.   Dagmar Hait, MD 03/21/13 [phone]

## 2013-03-21 NOTE — ED Notes (Signed)
Pt c/o urinary symptoms x 6 hrs and also reports cough and body aches x 2 days

## 2013-03-23 LAB — URINE CULTURE: Colony Count: >=100000

## 2013-03-24 ENCOUNTER — Telehealth (HOSPITAL_COMMUNITY): Payer: Self-pay | Admitting: Emergency Medicine

## 2013-03-24 NOTE — ED Notes (Signed)
Post ED Visit - Positive Culture Follow-up  Culture report reviewed by antimicrobial stewardship pharmacist: []  Wes Dulaney, Pharm.D., BCPS []  Celedonio Miyamoto, Pharm.D., BCPS []  Georgina Pillion, Pharm.D., BCPS []  [place], [place].D., BCPS, AAHIVP [x]  Estella Husk, Pharm.D., BCPS, AAHIVP  Positive urine culture Treated with Sulfa-Trimeth, organism sensitive to the same and no further patient follow-up is required at this time.  Kylie A Holland 03/24/2013, 5:09 PM

## 2014-12-08 ENCOUNTER — Emergency Department (HOSPITAL_BASED_OUTPATIENT_CLINIC_OR_DEPARTMENT_OTHER): Payer: Self-pay

## 2014-12-08 ENCOUNTER — Encounter (HOSPITAL_BASED_OUTPATIENT_CLINIC_OR_DEPARTMENT_OTHER): Payer: Self-pay

## 2014-12-08 ENCOUNTER — Emergency Department (HOSPITAL_BASED_OUTPATIENT_CLINIC_OR_DEPARTMENT_OTHER)
Admission: EM | Admit: 2014-12-08 | Discharge: 2014-12-08 | Disposition: A | Discharge status: Home / Self Care | Payer: Self-pay | Attending: Emergency Medicine | Admitting: Emergency Medicine

## 2014-12-08 DIAGNOSIS — Z880 Allergy status to penicillin: Secondary | ICD-10-CM | POA: Insufficient documentation

## 2014-12-08 DIAGNOSIS — R05 Cough: Secondary | ICD-10-CM

## 2014-12-08 DIAGNOSIS — J069 Acute upper respiratory infection, unspecified: Secondary | ICD-10-CM | POA: Insufficient documentation

## 2014-12-08 DIAGNOSIS — Z720 Tobacco use: Secondary | ICD-10-CM | POA: Insufficient documentation

## 2014-12-08 DIAGNOSIS — R059 Cough, unspecified: Secondary | ICD-10-CM

## 2014-12-08 MED ORDER — HYDROCODONE-HOMATROPINE 5-1.5 MG/5ML PO SYRP: 5.0000 mL | ORAL_SOLUTION | Freq: Four times a day (QID) | ORAL | Status: DC | PRN

## 2014-12-08 NOTE — ED Provider Notes (Signed)
CSN: [id number]     Arrival date & time 12/08/14  1336 History  This chart was scribed for Savannah SproutWhitney Eusebia Grulke, MD by Ronney LionSuzanne Le, ED Scribe. This patient was seen in room [facility] and the patient's care was started at 3:13 PM.    Chief Complaint  Patient presents with  . Nasal Congestion   The history is provided by the patient. No language interpreter was used.    HPI Comments: Savannah Perry is a 29 y.o. female who presents to the Emergency Department complaining of flu-like symptoms that began 1 week ago that improved for a short period of time, and then returned. Patient has had sick contact with her husband, who had the flu 2 weeks ago; his symptoms improved for some time and also resolved. She complains of thick, yellow nasal congestion, sore throat, dry cough, post-tussive vomiting, back pain, chest soreness from cough, fatigue, and the sensations of having "wet ears" in the morning. She endorses a subjective fever, but measured it this morning and didn't have one. Patient has tried Mucinex and Theraflu with no relief. She denies any medical conditions, and denies a history of bronchitis or asthma. Patient is a smoker. She works 2 jobs - one in [employer] and in [employer]. She is due to have her menses soon. She denies rash. Patient has known allergies to amoxicillin and penicillin.  History reviewed. No pertinent past medical history. Past Surgical History  Procedure Laterality Date  . Dilation and curettage of uterus     No family history on file. History  Substance Use Topics  . Smoking status: Current Every Day Smoker -- 1.00 packs/day    Types: Cigars  . Smokeless tobacco: Not on file  . Alcohol Use: No   OB History    No data available     Review of Systems  A complete 10 system review of systems was obtained and all systems are negative except as noted in the HPI and PMH.    Allergies  Amoxicillin and Penicillins cross reactors  Home Medications   Prior to Admission  medications   Not on File   BP 144/91 mmHg  Pulse 97  Temp(Src) 99.5 F (37.5 C) (Oral)  Resp 20  Wt 120 lb (54.432 kg)  SpO2 99% Physical Exam  Constitutional: She is oriented to person, place, and time. She appears well-developed and well-nourished. No distress.  HENT:  Head: Normocephalic and atraumatic.  Bilateral effusion of TMs; no erythema.  Oropharyngeal erythema; no exudate.  Eyes: Conjunctivae and EOM are normal.  Neck: Neck supple. No tracheal deviation present.  Cardiovascular: Normal rate, regular rhythm and normal heart sounds.   Pulmonary/Chest: Effort normal and breath sounds normal. No respiratory distress. She exhibits tenderness.  Abdominal: Soft. There is no tenderness.  Musculoskeletal: Normal range of motion.  Neurological: She is alert and oriented to person, place, and time.  Skin: Skin is warm and dry.  Psychiatric: She has a normal mood and affect. Her behavior is normal.  Nursing note and vitals reviewed.   ED Course  Procedures (including critical care time)  DIAGNOSTIC STUDIES: Oxygen Saturation is 99% on room air, normal by my interpretation.    COORDINATION OF CARE: 3:20 PM - Discussed treatment plan with pt at bedside which includes screening for pneumonia, and pt agreed to plan.   Labs Review  Labs Reviewed - No data to display  Imaging Review Dg Chest 2 View  12/08/2014   CLINICAL DATA:  Cough with congestion and fever  for 5 days  EXAM: CHEST  2 VIEW  COMPARISON:  03/21/2013  FINDINGS: The heart size and mediastinal contours are within normal limits. Both lungs are clear. The visualized skeletal structures are unremarkable.  IMPRESSION: No active cardiopulmonary disease.   Electronically Signed   By: Christiana Pellant M.D.   On: 12/08/2014 16:11     EKG Interpretation None      MDM   Final diagnoses:  Cough  URI (upper respiratory infection)    Pt with symptoms consistent with viral URI.  Well appearing here.  No signs of  breathing difficulty  No signs of pharyngitis, otitis or abnormal abdominal findings.   CXR wnl and pt to return with any further problems. I personally performed the services described in this documentation, which was scribed in my presence.  The recorded information has been reviewed and considered.     Savannah Sprout, MD 12/08/14 [phone]

## 2014-12-08 NOTE — ED Notes (Signed)
Patient here with congestion, general body aches, fever x 5 days, spouse with flu. No acute distress

## 2015-02-06 ENCOUNTER — Emergency Department (HOSPITAL_BASED_OUTPATIENT_CLINIC_OR_DEPARTMENT_OTHER)
Admission: EM | Admit: 2015-02-06 | Discharge: 2015-02-06 | Disposition: A | Discharge status: Home / Self Care | Payer: Self-pay | Attending: Emergency Medicine | Admitting: Emergency Medicine

## 2015-02-06 ENCOUNTER — Encounter (HOSPITAL_BASED_OUTPATIENT_CLINIC_OR_DEPARTMENT_OTHER): Payer: Self-pay

## 2015-02-06 DIAGNOSIS — Z3202 Encounter for pregnancy test, result negative: Secondary | ICD-10-CM | POA: Insufficient documentation

## 2015-02-06 DIAGNOSIS — B9689 Other specified bacterial agents as the cause of diseases classified elsewhere: Secondary | ICD-10-CM

## 2015-02-06 DIAGNOSIS — Z880 Allergy status to penicillin: Secondary | ICD-10-CM | POA: Insufficient documentation

## 2015-02-06 DIAGNOSIS — Z720 Tobacco use: Secondary | ICD-10-CM | POA: Insufficient documentation

## 2015-02-06 DIAGNOSIS — N760 Acute vaginitis: Secondary | ICD-10-CM | POA: Insufficient documentation

## 2015-02-06 LAB — WET PREP, GENITAL
Trich, Wet Prep: NONE SEEN
Yeast Wet Prep HPF POC: NONE SEEN

## 2015-02-06 LAB — URINALYSIS, ROUTINE W REFLEX MICROSCOPIC
Bilirubin Urine: NEGATIVE
GLUCOSE, UA: NEGATIVE mg/dL
HGB URINE DIPSTICK: NEGATIVE
Ketones, ur: 15 mg/dL — AB
Leukocytes, UA: NEGATIVE
Nitrite: NEGATIVE
Protein, ur: NEGATIVE mg/dL
SPECIFIC GRAVITY, URINE: 1.027 (ref 1.005–1.030)
UROBILINOGEN UA: 0.2 mg/dL (ref 0.0–1.0)
pH: 5.5 (ref 5.0–8.0)

## 2015-02-06 LAB — PREGNANCY, URINE: Preg Test, Ur: NEGATIVE

## 2015-02-06 MED ORDER — METRONIDAZOLE 500 MG PO TABS: 500.0000 mg | ORAL_TABLET | Freq: Two times a day (BID) | ORAL | Status: DC

## 2015-02-06 NOTE — Discharge Instructions (Signed)
Bacterial Vaginosis Bacterial vaginosis is a vaginal infection that occurs when the normal balance of bacteria in the vagina is disrupted. It results from an overgrowth of certain bacteria. This is the most common vaginal infection in women of childbearing age. Treatment is important to prevent complications, especially in pregnant women, as it can cause a premature delivery. CAUSES  Bacterial vaginosis is caused by an increase in harmful bacteria that are normally present in smaller amounts in the vagina. Several different kinds of bacteria can cause bacterial vaginosis. However, the reason that the condition develops is not fully understood. RISK FACTORS Certain activities or behaviors can put you at an increased risk of developing bacterial vaginosis, including:  Having a new sex partner or multiple sex partners.  Douching.  Using an intrauterine device (IUD) for contraception. Women do not get bacterial vaginosis from toilet seats, bedding, swimming pools, or contact with objects around them. SIGNS AND SYMPTOMS  Some women with bacterial vaginosis have no signs or symptoms. Common symptoms include:  Grey vaginal discharge.  A fishlike odor with discharge, especially after sexual intercourse.  Itching or burning of the vagina and vulva.  Burning or pain with urination. DIAGNOSIS  Your health care provider will take a medical history and examine the vagina for signs of bacterial vaginosis. A sample of vaginal fluid may be taken. Your health care provider will look at this sample under a microscope to check for bacteria and abnormal cells. A vaginal pH test may also be done.  TREATMENT  Bacterial vaginosis may be treated with antibiotic medicines. These may be given in the form of a pill or a vaginal cream. A second round of antibiotics may be prescribed if the condition comes back after treatment.  HOME CARE INSTRUCTIONS   Only take over-the-counter or prescription medicines as  directed by your health care provider.  If antibiotic medicine was prescribed, take it as directed. Make sure you finish it even if you start to feel better.  Do not have sex until treatment is completed.  Tell all sexual partners that you have a vaginal infection. They should see their health care provider and be treated if they have problems, such as a mild rash or itching.  Practice safe sex by using condoms and only having one sex partner. SEEK MEDICAL CARE IF:   Your symptoms are not improving after 3 days of treatment.  You have increased discharge or pain.  You have a fever. MAKE SURE YOU:   Understand these instructions.  Will watch your condition.  Will get help right away if you are not doing well or get worse. FOR MORE INFORMATION  Centers for Disease Control and Prevention, Division of STD Prevention: www.cdc.gov/std American Sexual Health Association (ASHA): www.ashastd.org  Document Released: 08/02/2005 Document Revised: 05/23/2013 Document Reviewed: 03/14/2013 [employer] Patient Information 2015 [employer]. This information is not intended to replace advice given to you by your health care provider. Make sure you discuss any questions you have with your health care provider.  

## 2015-02-06 NOTE — ED Provider Notes (Signed)
CSN: [id number]     Arrival date & time 02/06/15  1540 History   First MD Initiated Contact with Patient 02/06/15 1609     Chief Complaint  Patient presents with  . Vaginal Discharge     (Consider location/radiation/quality/duration/timing/severity/associated sxs/prior Treatment) HPI Comments:  Patient reports 2 week history of vaginal itching, discharge and pain. States it initially improved and recurred again after her menstrual cycle last week. She is sexually active with one person and uses protection. She denies any vaginal bleeding. No dysuria hematuria. No abdominal pain, back pain, fever, chills or vomiting. Nothing makes it better or worse.  The history is provided by the patient.    History reviewed. No pertinent past medical history. Past Surgical History  Procedure Laterality Date  . Dilation and curettage of uterus     No family history on file. History  Substance Use Topics  . Smoking status: Current Every Day Smoker -- 1.00 packs/day    Types: Cigars  . Smokeless tobacco: Not on file  . Alcohol Use: No   OB History    No data available     Review of Systems  Constitutional: Negative for fever, activity change and appetite change.  Respiratory: Negative for cough, chest tightness and shortness of breath.   Cardiovascular: Negative for chest pain.  Gastrointestinal: Negative for nausea, vomiting and abdominal pain.  Genitourinary: Positive for vaginal discharge. Negative for dysuria, hematuria and vaginal bleeding.  Musculoskeletal: Negative for myalgias and arthralgias.  Skin: Negative for rash.  Neurological: Negative for dizziness, weakness and headaches.  A complete 10 system review of systems was obtained and all systems are negative except as noted in the HPI and PMH.      Allergies  Amoxicillin and Penicillins cross reactors  Home Medications   Prior to Admission medications   Medication Sig Start Date End Date Taking? Authorizing Provider   BACLOFEN PO Take by mouth.   Yes Historical Provider, MD  IBUPROFEN PO Take by mouth.   Yes Historical Provider, MD  Phenazopyridine HCl (AZO TABS PO) Take by mouth.   Yes Historical Provider, MD  metroNIDAZOLE (FLAGYL) 500 MG tablet Take 1 tablet (500 mg total) by mouth 2 (two) times daily. 02/06/15   Glynn Octave, MD   BP 122/70 mmHg  Pulse 63  Temp(Src) 98.4 F (36.9 C) (Oral)  Resp 16  Ht  (1.575 m)  Wt 120 lb (54.432 kg)  BMI 21.94 kg/m2  SpO2 100%  LMP 01/29/2015 Physical Exam  Constitutional: She is oriented to person, place, and time. She appears well-developed and well-nourished. No distress.  HENT:  Head: Normocephalic and atraumatic.  Mouth/Throat: Oropharynx is clear and moist. No oropharyngeal exudate.  Eyes: Conjunctivae and EOM are normal. Pupils are equal, round, and reactive to light.  Neck: Normal range of motion. Neck supple.  No meningismus.  Cardiovascular: Normal rate, regular rhythm, normal heart sounds and intact distal pulses.   No murmur heard. Pulmonary/Chest: Effort normal and breath sounds normal. No respiratory distress.  Abdominal: Soft. There is no tenderness. There is no rebound and no guarding.  Genitourinary: Vaginal discharge found.   Chaperone present. Normal external genitalia. White discharge in vaginal vault. No CMT. No adnexal tenderness  Musculoskeletal: Normal range of motion. She exhibits no edema or tenderness.  Neurological: She is alert and oriented to person, place, and time. No cranial nerve deficit. She exhibits normal muscle tone. Coordination normal.  No ataxia on finger to nose bilaterally. No pronator drift. 5/5 strength  throughout. CN 2-12 intact. Negative Romberg. Equal grip strength. Sensation intact. Gait is normal.   Skin: Skin is warm.  Psychiatric: She has a normal mood and affect. Her behavior is normal.  Nursing note and vitals reviewed.   ED Course  Procedures (including critical care time) Labs  Review Labs Reviewed  WET PREP, GENITAL - Abnormal; Notable for the following:    Clue Cells Wet Prep HPF POC FEW (*)    WBC, Wet Prep HPF POC FEW (*)    All other components within normal limits  URINALYSIS, ROUTINE W REFLEX MICROSCOPIC (NOT AT [facility]) - Abnormal; Notable for the following:    APPearance CLOUDY (*)    Ketones, ur 15 (*)    All other components within normal limits  PREGNANCY, URINE  GC/CHLAMYDIA PROBE AMP ([place]) NOT AT [facility]    Imaging Review No results found.   EKG Interpretation None      MDM   Final diagnoses:  Bacterial vaginosis    2 weeks of vaginal discharge. Patient appears well. No abdominal pain or fever.   Pregnancy test negative. Urinalysis negative   wet prep shows bacerial vaginosis. We'll treat as ptient symptomatic. Referred to gynecology.  Glynn Octave, MD 02/06/15 [phone]

## 2015-02-06 NOTE — ED Notes (Signed)
Vaginal d/c x "few weeks"

## 2015-02-07 LAB — GC/CHLAMYDIA PROBE AMP (~~LOC~~) NOT AT ARMC
Chlamydia: NEGATIVE
Neisseria Gonorrhea: NEGATIVE

## 2015-04-28 ENCOUNTER — Encounter (HOSPITAL_BASED_OUTPATIENT_CLINIC_OR_DEPARTMENT_OTHER): Payer: Self-pay | Admitting: *Deleted

## 2015-04-28 ENCOUNTER — Emergency Department (HOSPITAL_BASED_OUTPATIENT_CLINIC_OR_DEPARTMENT_OTHER)
Admission: EM | Admit: 2015-04-28 | Discharge: 2015-04-28 | Disposition: A | Discharge status: Home / Self Care | Payer: Self-pay | Attending: Emergency Medicine | Admitting: Emergency Medicine

## 2015-04-28 DIAGNOSIS — Z3202 Encounter for pregnancy test, result negative: Secondary | ICD-10-CM | POA: Insufficient documentation

## 2015-04-28 DIAGNOSIS — N760 Acute vaginitis: Secondary | ICD-10-CM | POA: Insufficient documentation

## 2015-04-28 DIAGNOSIS — Z720 Tobacco use: Secondary | ICD-10-CM | POA: Insufficient documentation

## 2015-04-28 DIAGNOSIS — B9689 Other specified bacterial agents as the cause of diseases classified elsewhere: Secondary | ICD-10-CM

## 2015-04-28 DIAGNOSIS — Z880 Allergy status to penicillin: Secondary | ICD-10-CM | POA: Insufficient documentation

## 2015-04-28 LAB — URINALYSIS, ROUTINE W REFLEX MICROSCOPIC
Bilirubin Urine: NEGATIVE
Glucose, UA: NEGATIVE mg/dL
Hgb urine dipstick: NEGATIVE
Ketones, ur: NEGATIVE mg/dL
Nitrite: NEGATIVE
PROTEIN: NEGATIVE mg/dL
SPECIFIC GRAVITY, URINE: 1.027 (ref 1.005–1.030)
UROBILINOGEN UA: 0.2 mg/dL (ref 0.0–1.0)
pH: 5.5 (ref 5.0–8.0)

## 2015-04-28 LAB — WET PREP, GENITAL
Trich, Wet Prep: NONE SEEN
Yeast Wet Prep HPF POC: NONE SEEN

## 2015-04-28 LAB — URINE MICROSCOPIC-ADD ON

## 2015-04-28 LAB — PREGNANCY, URINE: PREG TEST UR: NEGATIVE

## 2015-04-28 MED ORDER — METRONIDAZOLE 500 MG PO TABS: 500.0000 mg | ORAL_TABLET | Freq: Two times a day (BID) | ORAL | Status: DC

## 2015-04-28 MED ORDER — IBUPROFEN 800 MG PO TABS
800.0000 mg | ORAL_TABLET | Freq: Once | ORAL | Status: AC
  Administered 2015-04-28: 800 mg via ORAL
  Filled 2015-04-28: qty 1

## 2015-04-28 MED ORDER — METRONIDAZOLE 500 MG PO TABS
500.0000 mg | ORAL_TABLET | Freq: Once | ORAL | Status: AC
  Administered 2015-04-28: 500 mg via ORAL
  Filled 2015-04-28: qty 1

## 2015-04-28 NOTE — Discharge Instructions (Signed)
Bacterial Vaginosis Follow-up with women's outpatient clinic. The hospital will call you within a few days if STD results are positive. Bacterial vaginosis is a vaginal infection that occurs when the normal balance of bacteria in the vagina is disrupted. It results from an overgrowth of certain bacteria. This is the most common vaginal infection in women of childbearing age. Treatment is important to prevent complications, especially in pregnant women, as it can cause a premature delivery. CAUSES  Bacterial vaginosis is caused by an increase in harmful bacteria that are normally present in smaller amounts in the vagina. Several different kinds of bacteria can cause bacterial vaginosis. However, the reason that the condition develops is not fully understood. RISK FACTORS Certain activities or behaviors can put you at an increased risk of developing bacterial vaginosis, including:  Having a new sex partner or multiple sex partners.  Douching.  Using an intrauterine device (IUD) for contraception. Women do not get bacterial vaginosis from toilet seats, bedding, swimming pools, or contact with objects around them. SIGNS AND SYMPTOMS  Some women with bacterial vaginosis have no signs or symptoms. Common symptoms include:  Grey vaginal discharge.  A fishlike odor with discharge, especially after sexual intercourse.  Itching or burning of the vagina and vulva.  Burning or pain with urination. DIAGNOSIS  Your health care provider will take a medical history and examine the vagina for signs of bacterial vaginosis. A sample of vaginal fluid may be taken. Your health care provider will look at this sample under a microscope to check for bacteria and abnormal cells. A vaginal pH test may also be done.  TREATMENT  Bacterial vaginosis may be treated with antibiotic medicines. These may be given in the form of a pill or a vaginal cream. A second round of antibiotics may be prescribed if the condition  comes back after treatment.  HOME CARE INSTRUCTIONS   Only take over-the-counter or prescription medicines as directed by your health care provider.  If antibiotic medicine was prescribed, take it as directed. Make sure you finish it even if you start to feel better.  Do not have sex until treatment is completed.  Tell all sexual partners that you have a vaginal infection. They should see their health care provider and be treated if they have problems, such as a mild rash or itching.  Practice safe sex by using condoms and only having one sex partner. SEEK MEDICAL CARE IF:   Your symptoms are not improving after 3 days of treatment.  You have increased discharge or pain.  You have a fever. MAKE SURE YOU:   Understand these instructions.  Will watch your condition.  Will get help right away if you are not doing well or get worse. FOR MORE INFORMATION  Centers for Disease Control and Prevention, Division of STD Prevention: [employer] American Sexual Health Association (ASHA): www.ashastd.org  Document Released: 08/02/2005 Document Revised: 05/23/2013 Document Reviewed: 03/14/2013 [facility] Patient Information 2015 [place], [place]. This information is not intended to replace advice given to you by your health care provider. Make sure you discuss any questions you have with your health care provider.

## 2015-04-28 NOTE — ED Notes (Signed)
C/o white/yellow vaginal discharge onset 2 weeks ago with foul smell. Also c/o left side of head pain with sharp pains and ear pain.

## 2015-04-28 NOTE — ED Provider Notes (Signed)
CSN: [id number]     Arrival date & time 04/28/15  1005 History   First MD Initiated Contact with Patient 04/28/15 1014     Chief complaint: Vaginal discharge and left ear pain   (Consider location/radiation/quality/duration/timing/severity/associated sxs/prior Treatment) The history is provided by the patient. No language interpreter was used.  Savannah Perry is a 29 year old female who presents for left ear pain since yesterday and odorous yellow vaginal discharge for the past week. She states she had vaginal discharge a few months ago and was told that it was BV. She states she has had 1 sexual partner in the last 6 months. She denies any history of STDs. Her last menstrual period was one week ago. She has taken Goody powder and ibuprofen for the headache with minimal relief. She has been taking cranberry pills and an over-the-counter pH balance product for the vaginal discharge. She denies any fever, chills, drainage from the ear, ringing in the ear, rash, dysuria, hematuria, urinary frequency, or vaginal bleeding.  History reviewed. No pertinent past medical history. Past Surgical History  Procedure Laterality Date  . Dilation and curettage of uterus     No family history on file. Social History  Substance Use Topics  . Smoking status: Current Every Day Smoker -- 1.00 packs/day    Types: Cigars  . Smokeless tobacco: None  . Alcohol Use: No   OB History    No data available     Review of Systems  Constitutional: Negative for fever and chills.  All other systems reviewed and are negative.     Allergies  Amoxicillin and Penicillins cross reactors  Home Medications   Prior to Admission medications   Medication Sig Start Date End Date Taking? Authorizing Provider  metroNIDAZOLE (FLAGYL) 500 MG tablet Take 1 tablet (500 mg total) by mouth 2 (two) times daily. 04/28/15   Deagan Sevin Patel-Mills, PA-C   BP 101/72 mmHg  Pulse 65  Temp(Src) 98.1 F (36.7 C) (Oral)  Resp 16  Ht 5\' 3"   (1.6 m)  Wt 115 lb (52.164 kg)  BMI 20.38 kg/m2  SpO2 99%  LMP 04/21/2015 Physical Exam  Constitutional: She is oriented to person, place, and time. She appears well-developed and well-nourished.  HENT:  Head: Normocephalic and atraumatic.  TMs and ear canals are normal bilaterally. No drainage from the ear. No tenderness to palpation of the tragus or pinna. No rash noted. No battle signs.   Multiple piercings on the ears and face but without surrounding erythema or drainage.  Eyes: Conjunctivae are normal.  Neck: Normal range of motion. Neck supple.  Cardiovascular: Normal rate, regular rhythm and normal heart sounds.   Pulmonary/Chest: Effort normal and breath sounds normal.  Abdominal: Soft. Normal appearance. She exhibits no distension. There is no tenderness. There is no rebound and no guarding.  Abdomen soft and nontender. No distention. No guarding or rebound.  Genitourinary: Pelvic exam was performed with patient supine.  Pelvic exam: Chaperone present. 1 piercing of the clitoris and 1 on the external vagina that do not appear to be infected. Small amount of thick white nonodorous discharge. No adnexal tenderness to palpation. No vaginal bleeding. Cervical os is closed.  Musculoskeletal: Normal range of motion.  Neurological: She is alert and oriented to person, place, and time.  Skin: Skin is warm and dry.  Nursing note and vitals reviewed.   ED Course  Procedures (including critical care time) Labs Review Labs Reviewed  WET PREP, GENITAL - Abnormal; Notable for the following:  Clue Cells Wet Prep HPF POC MODERATE (*)    WBC, Wet Prep HPF POC MANY (*)    All other components within normal limits  URINALYSIS, ROUTINE W REFLEX MICROSCOPIC (NOT AT [facility]) - Abnormal; Notable for the following:    APPearance CLOUDY (*)    Leukocytes, UA MODERATE (*)    All other components within normal limits  URINE MICROSCOPIC-ADD ON - Abnormal; Notable for the following:    Squamous  Epithelial / LPF MANY (*)    Bacteria, UA MANY (*)    Crystals CA OXALATE CRYSTALS (*)    All other components within normal limits  PREGNANCY, URINE  GC/CHLAMYDIA PROBE AMP ([place]) NOT AT [facility]    Imaging Review No results found. I have personally reviewed and evaluated these lab results as part of my medical decision-making.   EKG Interpretation None      MDM   Final diagnoses:  Bacterial vaginosis   Patient presents for left ear pain and odorous yellow vaginal discharge.  The ear appears normal and I do not suspect bacterial infection or Ramsay hunt syndrome. She is well-appearing and her vital signs are stable. Labs show moderate clue cells. Patient was treated for bacterial vaginosis. STD results pending. I explained that the hospital would call if the results were positive and that she would be treated at that time.   I discussed following up with women's outpatient clinic for further evaluation. I also discussed return precautions with the patient and she verbally agrees with the plan.      Catha Gosselin, PA-C 04/28/15 1241  Rolland Porter, MD 05/03/15 [phone]

## 2015-04-29 LAB — GC/CHLAMYDIA PROBE AMP (~~LOC~~) NOT AT ARMC
Chlamydia: NEGATIVE
Neisseria Gonorrhea: NEGATIVE

## 2015-05-25 ENCOUNTER — Emergency Department (HOSPITAL_BASED_OUTPATIENT_CLINIC_OR_DEPARTMENT_OTHER)
Admission: EM | Admit: 2015-05-25 | Discharge: 2015-05-25 | Disposition: A | Discharge status: Home / Self Care | Payer: Self-pay | Attending: Emergency Medicine | Admitting: Emergency Medicine

## 2015-05-25 ENCOUNTER — Encounter (HOSPITAL_BASED_OUTPATIENT_CLINIC_OR_DEPARTMENT_OTHER): Payer: Self-pay | Admitting: Emergency Medicine

## 2015-05-25 DIAGNOSIS — Y9389 Activity, other specified: Secondary | ICD-10-CM | POA: Insufficient documentation

## 2015-05-25 DIAGNOSIS — Y9289 Other specified places as the place of occurrence of the external cause: Secondary | ICD-10-CM | POA: Insufficient documentation

## 2015-05-25 DIAGNOSIS — Z720 Tobacco use: Secondary | ICD-10-CM | POA: Insufficient documentation

## 2015-05-25 DIAGNOSIS — W273XXA Contact with needle (sewing), initial encounter: Secondary | ICD-10-CM | POA: Insufficient documentation

## 2015-05-25 DIAGNOSIS — S61232A Puncture wound without foreign body of right middle finger without damage to nail, initial encounter: Secondary | ICD-10-CM | POA: Insufficient documentation

## 2015-05-25 DIAGNOSIS — S6981XA Other specified injuries of right wrist, hand and finger(s), initial encounter: Secondary | ICD-10-CM

## 2015-05-25 DIAGNOSIS — Y998 Other external cause status: Secondary | ICD-10-CM | POA: Insufficient documentation

## 2015-05-25 DIAGNOSIS — Z880 Allergy status to penicillin: Secondary | ICD-10-CM | POA: Insufficient documentation

## 2015-05-25 DIAGNOSIS — Z79899 Other long term (current) drug therapy: Secondary | ICD-10-CM | POA: Insufficient documentation

## 2015-05-25 DIAGNOSIS — IMO0001 Reserved for inherently not codable concepts without codable children: Secondary | ICD-10-CM

## 2015-05-25 LAB — RAPID HIV SCREEN (HIV 1/2 AB+AG)
HIV 1/2 Antibodies: NONREACTIVE
HIV-1 P24 Antigen - HIV24: NONREACTIVE

## 2015-05-25 NOTE — ED Provider Notes (Signed)
CSN: [id number]     Arrival date & time 05/25/15  1650 History   First MD Initiated Contact with Patient 05/25/15 1654     Chief Complaint  Patient presents with  . Body Fluid Exposure     (Consider location/radiation/quality/duration/timing/severity/associated sxs/prior Treatment) HPI Earnestine Shipp is a 29 y.o. female who comes in for evaluation after a needlestick. Patient reports at approximately 4:00 this afternoon, while trying on pants at [employer] [facility] [employer] [facility], she was stuck by a needle in the pants pocket. She was stuck on the dorsum of her right middle finger. She denies any other medical problems or symptoms at this time. Nothing tried to improve symptoms. No thoracic or modifying factors. Patient states she would like to have fluid exposure panel done.  History reviewed. No pertinent past medical history. Past Surgical History  Procedure Laterality Date  . Dilation and curettage of uterus     No family history on file. Social History  Substance Use Topics  . Smoking status: Current Every Day Smoker -- 1.00 packs/day    Types: Cigars  . Smokeless tobacco: None  . Alcohol Use: No   OB History    No data available     Review of Systems A 10 point review of systems was completed and was negative except for pertinent positives and negatives as mentioned in the history of present illness     Allergies  Amoxicillin and Penicillins cross reactors  Home Medications   Prior to Admission medications   Medication Sig Start Date End Date Taking? Authorizing Provider  metroNIDAZOLE (FLAGYL) 500 MG tablet Take 1 tablet (500 mg total) by mouth 2 (two) times daily. 04/28/15   Hanna Patel-Mills, PA-C   BP 120/84 mmHg  Pulse 76  Temp(Src) 98.8 F (37.1 C) (Oral)  Resp 18  Ht  (1.575 m)  Wt 115 lb (52.164 kg)  BMI 21.03 kg/m2  SpO2 100%  LMP 04/21/2015 Physical Exam  Constitutional:  Awake, alert, nontoxic appearance.  HENT:  Head: Atraumatic.  Eyes: Right eye exhibits no  discharge. Left eye exhibits no discharge.  Neck: Neck supple.  Cardiovascular: Normal rate, regular rhythm and normal heart sounds.   Pulmonary/Chest: Effort normal. She exhibits no tenderness.  Abdominal: Soft. There is no tenderness. There is no rebound.  Musculoskeletal: She exhibits no tenderness.  Baseline ROM, no obvious new focal weakness.  Neurological:  Mental status and motor strength appears baseline for patient and situation.  Skin: No rash noted.  Very small, puncture wound noted to dorsum of right middle finger. No active bleeding. No surrounding erythema or other evidence of infection.  Psychiatric: She has a normal mood and affect.  Nursing note and vitals reviewed.   ED Course  Procedures (including critical care time) Labs Review Labs Reviewed  RAPID HIV SCREEN (HIV 1/2 AB+AG)  HEPATITIS B SURFACE ANTIGEN  HEPATITIS C ANTIBODY (REFLEX)    Imaging Review No results found. I have personally reviewed and evaluated these images and lab results as part of my medical decision-making.   EKG Interpretation None     Filed Vitals:   05/25/15 1654  BP: 120/84  Pulse: 76  Temp: 98.8 F (37.1 C)  TempSrc: Oral  Resp: 18  Height:  (1.575 m)  Weight: 115 lb (52.164 kg)  SpO2: 100%    MDM  Xuan Mateus is a 29 y.o. female who presents today for evaluation of possible body fluid exposure after needle stick. Patient stuck by a needle attached to syringe in right  pants pocket of jeans she was trying on a [employer]. Needle appears to be very small, roughly 27-gauge. No blood seen on needle. Very small puncture wound to right middle finger of the patient. Patient is otherwise healthy and immunocompetent. She requests to have body fluid exposure panel ordered. Discussed will need to follow-up with PCP and have panel redrawn in 3 weeks. Patient voices understanding. No evidence of other acute or emergent pathology at this time. Patient appears well, hemodynamically stable  with normal vital signs and is appropriate for discharge.  Final diagnoses:  Needle stick injury of finger, right, initial encounter       Joycie Peek, PA-C 05/25/15 2337  Raeford Razor, MD 05/25/15 2344

## 2015-05-25 NOTE — Discharge Instructions (Signed)
You were evaluated in the ED today for your needle stick injury. You had labs drawn for hepatitis as well as HIV. You will need follow-up with your doctor in 3 weeks for reevaluation of these labs and laboratory draws. Return to ED for worsening symptoms including fevers, chills, abdominal pain, nausea or vomiting, night sweats.

## 2015-05-25 NOTE — ED Notes (Signed)
Pt states she was stuck by a needle in her left hand while trying on some pants from [employer]

## 2015-05-27 LAB — HEPATITIS C ANTIBODY (REFLEX): HCV Ab: <0.1 s/co ratio (ref 0.0–0.9)

## 2015-05-27 LAB — HEPATITIS B SURFACE ANTIGEN: Hepatitis B Surface Ag: NEGATIVE

## 2015-05-27 LAB — HCV COMMENT:

## 2015-11-13 ENCOUNTER — Emergency Department (HOSPITAL_BASED_OUTPATIENT_CLINIC_OR_DEPARTMENT_OTHER)
Admission: EM | Admit: 2015-11-13 | Discharge: 2015-11-13 | Disposition: A | Discharge status: Home / Self Care | Payer: No Typology Code available for payment source | Attending: Emergency Medicine | Admitting: Emergency Medicine

## 2015-11-13 ENCOUNTER — Encounter (HOSPITAL_BASED_OUTPATIENT_CLINIC_OR_DEPARTMENT_OTHER): Payer: Self-pay

## 2015-11-13 DIAGNOSIS — N390 Urinary tract infection, site not specified: Secondary | ICD-10-CM

## 2015-11-13 DIAGNOSIS — F17210 Nicotine dependence, cigarettes, uncomplicated: Secondary | ICD-10-CM | POA: Insufficient documentation

## 2015-11-13 DIAGNOSIS — R319 Hematuria, unspecified: Secondary | ICD-10-CM

## 2015-11-13 DIAGNOSIS — Z3202 Encounter for pregnancy test, result negative: Secondary | ICD-10-CM | POA: Insufficient documentation

## 2015-11-13 DIAGNOSIS — R300 Dysuria: Secondary | ICD-10-CM

## 2015-11-13 DIAGNOSIS — Z880 Allergy status to penicillin: Secondary | ICD-10-CM | POA: Insufficient documentation

## 2015-11-13 LAB — URINE MICROSCOPIC-ADD ON: WBC, UA: NONE SEEN WBC/hpf (ref 0–5)

## 2015-11-13 LAB — URINALYSIS, ROUTINE W REFLEX MICROSCOPIC
Bilirubin Urine: NEGATIVE
GLUCOSE, UA: NEGATIVE mg/dL
Ketones, ur: NEGATIVE mg/dL
LEUKOCYTES UA: NEGATIVE
Nitrite: NEGATIVE
Protein, ur: NEGATIVE mg/dL
Specific Gravity, Urine: 1.015 (ref 1.005–1.030)
pH: 6 (ref 5.0–8.0)

## 2015-11-13 LAB — PREGNANCY, URINE: Preg Test, Ur: NEGATIVE

## 2015-11-13 MED ORDER — PHENAZOPYRIDINE HCL 200 MG PO TABS
200.0000 mg | ORAL_TABLET | Freq: Three times a day (TID) | ORAL | Status: DC
Start: 2015-11-13 — End: 2016-07-03

## 2015-11-13 MED ORDER — CEPHALEXIN 500 MG PO CAPS: 500.0000 mg | ORAL_CAPSULE | Freq: Two times a day (BID) | ORAL | Status: DC

## 2015-11-13 NOTE — ED Provider Notes (Signed)
CSN: [id number]     Arrival date & time 11/13/15  1224 History   First MD Initiated Contact with Patient 11/13/15 1230     Chief Complaint  Patient presents with  . Hematuria     (Consider location/radiation/quality/duration/timing/severity/associated sxs/prior Treatment) Patient is a 30 y.o. female presenting with hematuria. The history is provided by the patient and medical records.  Hematuria   30 year old female here with complaint of hematuria. Patient states she's been having some urethral irritation and dysuria for the past week.  Yesterday she began noticing small amounts of blood in her urine, got worse after sexual intercourse last night.  Denies abdominal pain or pelvic pain.  No vaginal discharge.  No concern for STD, no new sexual partners.  Patient states she looked "down there" and her urethra appeared very red and irritated this morning.  Has been taking AZO pills without relief. Does report hx of frequent UTI's, last incidence over 1 year ago.  History reviewed. No pertinent past medical history. Past Surgical History  Procedure Laterality Date  . Dilation and curettage of uterus     No family history on file. Social History  Substance Use Topics  . Smoking status: Current Every Day Smoker -- 0.00 packs/day  . Smokeless tobacco: None  . Alcohol Use: Yes     Comment: occ   OB History    No data available     Review of Systems  Genitourinary: Positive for dysuria and hematuria.  All other systems reviewed and are negative.     Allergies  Amoxicillin and Penicillins cross reactors  Home Medications   Prior to Admission medications   Not on File   BP 118/85 mmHg  Pulse 88  Temp(Src) 98.1 F (36.7 C) (Oral)  Resp 16  Ht  (1.575 m)  Wt 50.803 kg  BMI 20.48 kg/m2  SpO2 100%  LMP 10/21/2015   Physical Exam  Constitutional: She is oriented to person, place, and time. She appears well-developed and well-nourished.  HENT:  Head: Normocephalic  and atraumatic.  Mouth/Throat: Oropharynx is clear and moist.  Eyes: Conjunctivae and EOM are normal. Pupils are equal, round, and reactive to light.  Neck: Normal range of motion.  Cardiovascular: Normal rate, regular rhythm and normal heart sounds.   Pulmonary/Chest: Effort normal and breath sounds normal.  Abdominal: Soft. Bowel sounds are normal. There is no tenderness. There is no guarding and no CVA tenderness.  Musculoskeletal: Normal range of motion.  Neurological: She is alert and oriented to person, place, and time.  Skin: Skin is warm and dry.  Psychiatric: She has a normal mood and affect.  Nursing note and vitals reviewed.   ED Course  Procedures (including critical care time) Labs Review Labs Reviewed  URINALYSIS, ROUTINE W REFLEX MICROSCOPIC (NOT AT [facility]) - Abnormal; Notable for the following:    Hgb urine dipstick SMALL (*)    All other components within normal limits  URINE MICROSCOPIC-ADD ON - Abnormal; Notable for the following:    Squamous Epithelial / LPF 0-5 (*)    Bacteria, UA FEW (*)    All other components within normal limits  PREGNANCY, URINE    Imaging Review No results found. I have personally reviewed and evaluated these images and lab results as part of my medical decision-making.   EKG Interpretation None      MDM   Final diagnoses:  UTI (lower urinary tract infection)  Dysuria  Hematuria   30 year old female here with dysuria and urethral  irritation for the past week. Patient afebrile, nontoxic. Abdominal exam is benign, no CVA tenderness.  Urine preg negative.  U/a with small blood, few bacteria. No current vaginal symptoms, no expressed concern for STD.  Will treat for potentially early/developing UTI with keflex and pyridium.  FU with PCP.  Discussed plan with patient, he/she acknowledged understanding and agreed with plan of care.  Return precautions given for new or worsening symptoms.  Garlon HatchetLisa M Shannon Kirkendall, PA-C 11/13/15 1327  Vanetta MuldersScott  Zackowski, MD 11/13/15 1459

## 2015-11-13 NOTE — ED Notes (Addendum)
C/o hematuria-NAD-steady gait

## 2015-11-13 NOTE — Discharge Instructions (Signed)
Take the prescribed medication as directed. °Follow-up with your primary care doctor. °Return to the ED for new or worsening symptoms. °

## 2015-11-22 ENCOUNTER — Encounter (HOSPITAL_BASED_OUTPATIENT_CLINIC_OR_DEPARTMENT_OTHER): Payer: Self-pay | Admitting: Emergency Medicine

## 2015-11-22 ENCOUNTER — Emergency Department (HOSPITAL_BASED_OUTPATIENT_CLINIC_OR_DEPARTMENT_OTHER)
Admission: EM | Admit: 2015-11-22 | Discharge: 2015-11-22 | Disposition: A | Discharge status: Home / Self Care | Payer: No Typology Code available for payment source | Attending: Emergency Medicine | Admitting: Emergency Medicine

## 2015-11-22 DIAGNOSIS — N760 Acute vaginitis: Secondary | ICD-10-CM | POA: Insufficient documentation

## 2015-11-22 DIAGNOSIS — Z87440 Personal history of urinary (tract) infections: Secondary | ICD-10-CM | POA: Insufficient documentation

## 2015-11-22 DIAGNOSIS — Z880 Allergy status to penicillin: Secondary | ICD-10-CM | POA: Insufficient documentation

## 2015-11-22 DIAGNOSIS — Z9889 Other specified postprocedural states: Secondary | ICD-10-CM | POA: Insufficient documentation

## 2015-11-22 DIAGNOSIS — Z792 Long term (current) use of antibiotics: Secondary | ICD-10-CM | POA: Insufficient documentation

## 2015-11-22 DIAGNOSIS — B379 Candidiasis, unspecified: Secondary | ICD-10-CM | POA: Insufficient documentation

## 2015-11-22 DIAGNOSIS — F17200 Nicotine dependence, unspecified, uncomplicated: Secondary | ICD-10-CM | POA: Insufficient documentation

## 2015-11-22 DIAGNOSIS — Z3202 Encounter for pregnancy test, result negative: Secondary | ICD-10-CM | POA: Insufficient documentation

## 2015-11-22 DIAGNOSIS — Z79899 Other long term (current) drug therapy: Secondary | ICD-10-CM | POA: Insufficient documentation

## 2015-11-22 DIAGNOSIS — B9689 Other specified bacterial agents as the cause of diseases classified elsewhere: Secondary | ICD-10-CM

## 2015-11-22 LAB — URINALYSIS, ROUTINE W REFLEX MICROSCOPIC
BILIRUBIN URINE: NEGATIVE
GLUCOSE, UA: NEGATIVE mg/dL
KETONES UR: NEGATIVE mg/dL
LEUKOCYTES UA: NEGATIVE
NITRITE: NEGATIVE
PH: 5.5 (ref 5.0–8.0)
PROTEIN: NEGATIVE mg/dL
Specific Gravity, Urine: 1.025 (ref 1.005–1.030)

## 2015-11-22 LAB — WET PREP, GENITAL
SPERM: NONE SEEN
TRICH WET PREP: NONE SEEN

## 2015-11-22 LAB — URINE MICROSCOPIC-ADD ON

## 2015-11-22 LAB — PREGNANCY, URINE: Preg Test, Ur: NEGATIVE

## 2015-11-22 MED ORDER — METRONIDAZOLE 500 MG PO TABS: 500.0000 mg | ORAL_TABLET | Freq: Two times a day (BID) | ORAL | Status: DC

## 2015-11-22 MED ORDER — FLUCONAZOLE 50 MG PO TABS
150.0000 mg | ORAL_TABLET | Freq: Once | ORAL | Status: AC
  Administered 2015-11-22: 150 mg via ORAL
  Filled 2015-11-22: qty 1

## 2015-11-22 NOTE — ED Provider Notes (Signed)
CSN: [id number]     Arrival date & time 11/22/15  1134 History   First MD Initiated Contact with Patient 11/22/15 1201     Chief Complaint  Patient presents with  . Vaginal Pain   HPI  Savannah Perry is a 30 year old female presenting with vaginal discomfort. Patient was treated for a urinary tract infection a week and a half ago. She reports completion of her antibiotics. She states that she had hematuria with her urinary tract infection. She thinks that her UTI has returned as her urine was slightly pink yesterday. She is also complaining of redness of the skin around her urethra with mild irritation over the past 2-3 days. She denies change in personal hygiene products or underwear. Denies fevers, chills, flank pain, dysuria, dyspareunia, or vaginal discharge. She denies concern for STD exposure.  History reviewed. No pertinent past medical history. Past Surgical History  Procedure Laterality Date  . Dilation and curettage of uterus     History reviewed. No pertinent family history. Social History  Substance Use Topics  . Smoking status: Current Every Day Smoker -- 0.00 packs/day  . Smokeless tobacco: None  . Alcohol Use: Yes     Comment: occ   OB History    No data available     Review of Systems  Genitourinary: Positive for hematuria and pelvic pain.  All other systems reviewed and are negative.  Allergies  Amoxicillin and Penicillins cross reactors  Home Medications   Prior to Admission medications   Medication Sig Start Date End Date Taking? Authorizing Provider  cephALEXin (KEFLEX) 500 MG capsule Take 1 capsule (500 mg total) by mouth 2 (two) times daily. 11/13/15   Garlon Hatchet, PA-C  metroNIDAZOLE (FLAGYL) 500 MG tablet Take 1 tablet (500 mg total) by mouth 2 (two) times daily. 11/22/15   Kippy Gohman, PA-C  phenazopyridine (PYRIDIUM) 200 MG tablet Take 1 tablet (200 mg total) by mouth 3 (three) times daily. 11/13/15   Garlon Hatchet, PA-C   BP 110/72 mmHg  Pulse 70   Temp(Src) 97.7 F (36.5 C) (Oral)  Resp 16  Ht  (1.575 m)  Wt 50.803 kg  BMI 20.48 kg/m2  SpO2 98%  LMP 10/21/2015 Physical Exam  Constitutional: She appears well-developed and well-nourished. No distress.  Nontoxic-appearing  HENT:  Head: Normocephalic and atraumatic.  Right Ear: External ear normal.  Left Ear: External ear normal.  Eyes: Conjunctivae are normal. Right eye exhibits no discharge. Left eye exhibits no discharge. No scleral icterus.  Neck: Normal range of motion.  Cardiovascular: Normal rate.   Pulmonary/Chest: Effort normal.  Abdominal: Soft. She exhibits no distension. There is no tenderness.  Genitourinary: Cervix exhibits no motion tenderness, no discharge and no friability. Right adnexum displays no tenderness and no fullness. Left adnexum displays no tenderness and no fullness. Vaginal discharge found.  Moderate amount of white, thin vaginal discharge on exam. No cervical motion tenderness or adnexal fullness or tenderness.  Musculoskeletal: Normal range of motion.  Moves all extremities spontaneously  Lymphadenopathy:       Right: No inguinal adenopathy present.       Left: No inguinal adenopathy present.  Neurological: She is alert. Coordination normal.  Skin: Skin is warm and dry.  Psychiatric: She has a normal mood and affect. Her behavior is normal.  Nursing note and vitals reviewed.   ED Course  Procedures (including critical care time) Labs Review Labs Reviewed  WET PREP, GENITAL - Abnormal; Notable for the following:  Yeast Wet Prep HPF POC PRESENT (*)    Clue Cells Wet Prep HPF POC PRESENT (*)    WBC, Wet Prep HPF POC MODERATE (*)    All other components within normal limits  URINALYSIS, ROUTINE W REFLEX MICROSCOPIC (NOT AT [facility]) - Abnormal; Notable for the following:    APPearance CLOUDY (*)    Hgb urine dipstick SMALL (*)    All other components within normal limits  URINE MICROSCOPIC-ADD ON - Abnormal; Notable for the following:     Squamous Epithelial / LPF 0-5 (*)    Bacteria, UA FEW (*)    All other components within normal limits  PREGNANCY, URINE  GC/CHLAMYDIA PROBE AMP ([place]) NOT AT [facility]    Imaging Review No results found. I have personally reviewed and evaluated these images and lab results as part of my medical decision-making.   EKG Interpretation None      MDM   Final diagnoses:  Yeast infection  BV (bacterial vaginosis)   30 year old female presenting with labial irritation and 1 episode of hematuria. Afebrile and nontoxic appearing. Abdomen is soft, nontender without peritoneal signs. Moderate amount of white vaginal discharge on pelvic exam. No cervical motion tenderness or adnexal fullness or tenderness. Urinalysis negative for infection. Negative pregnancy test. Wet prep with yeast and clue cells. Given 150 mg Diflucan in emergency department. Will discharge with Flagyl. Patient is to follow-up with her PCP if symptoms do not improve. Return precautions given in discharge paperwork and discussed with pt at bedside. Pt stable for discharge     Alveta HeimlichStevi Webber Michiels, PA-C 11/22/15 1514  Rolan BuccoMelanie Belfi, MD 11/23/15 [phone]

## 2015-11-22 NOTE — Discharge Instructions (Signed)
Bacterial Vaginosis °Bacterial vaginosis is a vaginal infection that occurs when the normal balance of bacteria in the vagina is disrupted. It results from an overgrowth of certain bacteria. This is the most common vaginal infection in women of childbearing age. Treatment is important to prevent complications, especially in pregnant women, as it can cause a premature delivery. °CAUSES  °Bacterial vaginosis is caused by an increase in harmful bacteria that are normally present in smaller amounts in the vagina. Several different kinds of bacteria can cause bacterial vaginosis. However, the reason that the condition develops is not fully understood. °RISK FACTORS °Certain activities or behaviors can put you at an increased risk of developing bacterial vaginosis, including: °· Having a new sex partner or multiple sex partners. °· Douching. °· Using an intrauterine device (IUD) for contraception. °Women do not get bacterial vaginosis from toilet seats, bedding, swimming pools, or contact with objects around them. °SIGNS AND SYMPTOMS  °Some women with bacterial vaginosis have no signs or symptoms. Common symptoms include: °· Grey vaginal discharge. °· A fishlike odor with discharge, especially after sexual intercourse. °· Itching or burning of the vagina and vulva. °· Burning or pain with urination. °DIAGNOSIS  °Your health care provider will take a medical history and examine the vagina for signs of bacterial vaginosis. A sample of vaginal fluid may be taken. Your health care provider will look at this sample under a microscope to check for bacteria and abnormal cells. A vaginal pH test may also be done.  °TREATMENT  °Bacterial vaginosis may be treated with antibiotic medicines. These may be given in the form of a pill or a vaginal cream. A second round of antibiotics may be prescribed if the condition comes back after treatment. Because bacterial vaginosis increases your risk for sexually transmitted diseases, getting  treated can help reduce your risk for chlamydia, gonorrhea, HIV, and herpes. °HOME CARE INSTRUCTIONS  °· Only take over-the-counter or prescription medicines as directed by your health care provider. °· If antibiotic medicine was prescribed, take it as directed. Make sure you finish it even if you start to feel better. °· Tell all sexual partners that you have a vaginal infection. They should see their health care provider and be treated if they have problems, such as a mild rash or itching. °· During treatment, it is important that you follow these instructions: °¨ Avoid sexual activity or use condoms correctly. °¨ Do not douche. °¨ Avoid alcohol as directed by your health care provider. °¨ Avoid breastfeeding as directed by your health care provider. °SEEK MEDICAL CARE IF:  °· Your symptoms are not improving after 3 days of treatment. °· You have increased discharge or pain. °· You have a fever. °MAKE SURE YOU:  °· Understand these instructions. °· Will watch your condition. °· Will get help right away if you are not doing well or get worse. °FOR MORE INFORMATION  °Centers for Disease Control and Prevention, Division of STD Prevention: www.cdc.gov/std °American Sexual Health Association (ASHA): www.ashastd.org  °  °This information is not intended to replace advice given to you by your health care provider. Make sure you discuss any questions you have with your health care provider. °  °Document Released: 08/02/2005 Document Revised: 08/23/2014 Document Reviewed: 03/14/2013 °Elsevier Interactive Patient Education ©2016 Elsevier Inc. ° ° ° °Vaginitis °Vaginitis is an inflammation of the vagina. It is most often caused by a change in the normal balance of the bacteria and yeast that live in the vagina. This change in balance   causes an overgrowth of certain bacteria or yeast, which causes the inflammation. There are different types of vaginitis, but the most common types are: °· Bacterial vaginosis. °· Yeast  infection (candidiasis). °· Trichomoniasis vaginitis. This is a sexually transmitted infection (STI). °· Viral vaginitis. °· Atrophic vaginitis. °· Allergic vaginitis. °CAUSES  °The cause depends on the type of vaginitis. Vaginitis can be caused by: °· Bacteria (bacterial vaginosis). °· Yeast (yeast infection). °· A parasite (trichomoniasis vaginitis) °· A virus (viral vaginitis). °· Low hormone levels (atrophic vaginitis). Low hormone levels can occur during pregnancy, breastfeeding, or after menopause. °· Irritants, such as bubble baths, scented tampons, and feminine sprays (allergic vaginitis). °Other factors can change the normal balance of the yeast and bacteria that live in the vagina. These include: °· Antibiotic medicines. °· Poor hygiene. °· Diaphragms, vaginal sponges, spermicides, birth control pills, and intrauterine devices (IUD). °· Sexual intercourse. °· Infection. °· Uncontrolled diabetes. °· A weakened immune system. °SYMPTOMS  °Symptoms can vary depending on the cause of the vaginitis. Common symptoms include: °· Abnormal vaginal discharge. °¨ The discharge is white, gray, or yellow with bacterial vaginosis. °¨ The discharge is thick, white, and cheesy with a yeast infection. °¨ The discharge is frothy and yellow or greenish with trichomoniasis. °· A bad vaginal odor. °¨ The odor is fishy with bacterial vaginosis. °· Vaginal itching, pain, or swelling. °· Painful intercourse. °· Pain or burning when urinating. °Sometimes, there are no symptoms. °TREATMENT  °Treatment will vary depending on the type of infection.  °· Bacterial vaginosis and trichomoniasis are often treated with antibiotic creams or pills. °· Yeast infections are often treated with antifungal medicines, such as vaginal creams or suppositories. °· Viral vaginitis has no cure, but symptoms can be treated with medicines that relieve discomfort. Your sexual partner should be treated as well. °· Atrophic vaginitis may be treated with an  estrogen cream, pill, suppository, or vaginal ring. If vaginal dryness occurs, lubricants and moisturizing creams may help. You may be told to avoid scented soaps, sprays, or douches. °· Allergic vaginitis treatment involves quitting the use of the product that is causing the problem. Vaginal creams can be used to treat the symptoms. °HOME CARE INSTRUCTIONS  °· Take all medicines as directed by your caregiver. °· Keep your genital area clean and dry. Avoid soap and only rinse the area with water. °· Avoid douching. It can remove the healthy bacteria in the vagina. °· Do not use tampons or have sexual intercourse until your vaginitis has been treated. Use sanitary pads while you have vaginitis. °· Wipe from front to back. This avoids the spread of bacteria from the rectum to the vagina. °· Let air reach your genital area. °¨ Wear cotton underwear to decrease moisture buildup. °¨ Avoid wearing underwear while you sleep until your vaginitis is gone. °¨ Avoid tight pants and underwear or nylons without a cotton panel. °¨ Take off wet clothing (especially bathing suits) as soon as possible. °· Use mild, non-scented products. Avoid using irritants, such as: °¨ Scented feminine sprays. °¨ Fabric softeners. °¨ Scented detergents. °¨ Scented tampons. °¨ Scented soaps or bubble baths. °· Practice safe sex and use condoms. Condoms may prevent the spread of trichomoniasis and viral vaginitis. °SEEK MEDICAL CARE IF:  °· You have abdominal pain. °· You have a fever or persistent symptoms for more than 2-3 days. °· You have a fever and your symptoms suddenly get worse. °  °This information is not intended to replace advice given   to you by your health care provider. Make sure you discuss any questions you have with your health care provider. °  °Document Released: 05/30/2007 Document Revised: 12/17/2014 Document Reviewed: 01/13/2012 °Elsevier Interactive Patient Education ©2016 Elsevier Inc. ° °

## 2015-11-22 NOTE — ED Notes (Signed)
Pt recently treated for uti, completed course of medication, pt continues to complain of redness around meatus when inspecting area, pt reports recent sexual intercourse x 1 day

## 2015-11-24 LAB — GC/CHLAMYDIA PROBE AMP (~~LOC~~) NOT AT ARMC
CHLAMYDIA, DNA PROBE: NEGATIVE
Neisseria Gonorrhea: NEGATIVE

## 2016-02-02 ENCOUNTER — Encounter (HOSPITAL_BASED_OUTPATIENT_CLINIC_OR_DEPARTMENT_OTHER): Payer: Self-pay | Admitting: Emergency Medicine

## 2016-02-02 ENCOUNTER — Emergency Department (HOSPITAL_BASED_OUTPATIENT_CLINIC_OR_DEPARTMENT_OTHER)
Admission: EM | Admit: 2016-02-02 | Discharge: 2016-02-02 | Disposition: A | Discharge status: Home / Self Care | Payer: No Typology Code available for payment source | Attending: Emergency Medicine | Admitting: Emergency Medicine

## 2016-02-02 DIAGNOSIS — F17210 Nicotine dependence, cigarettes, uncomplicated: Secondary | ICD-10-CM | POA: Insufficient documentation

## 2016-02-02 DIAGNOSIS — B370 Candidal stomatitis: Secondary | ICD-10-CM

## 2016-02-02 LAB — RAPID HIV SCREEN (HIV 1/2 AB+AG)
HIV 1/2 Antibodies: NONREACTIVE
HIV-1 P24 ANTIGEN - HIV24: NONREACTIVE

## 2016-02-02 LAB — RAPID STREP SCREEN (MED CTR MEBANE ONLY): Streptococcus, Group A Screen (Direct): NEGATIVE

## 2016-02-02 MED ORDER — NYSTATIN 100000 UNIT/ML MT SUSP: 500000.0000 [IU] | Freq: Four times a day (QID) | OROMUCOSAL | Status: DC

## 2016-02-02 NOTE — ED Notes (Signed)
Pt made aware to return if symptoms worsen or if any life threatening symptoms occur.   

## 2016-02-02 NOTE — ED Provider Notes (Signed)
CSN: [id number]     Arrival date & time 02/02/16  1627 History  By signing my name below, I, Savannah Perry, attest that this documentation has been prepared under the direction and in the presence of Lyndal Pulleyaniel Brennen Gardiner, MD . Electronically Signed: Marisue HumbleMichelle Perry, Scribe. 02/02/2016. 5:07 PM.   Chief Complaint  Patient presents with  . Sore Throat   The history is provided by the patient. No language interpreter was used.   HPI Comments:  Ky BarbanJamie Perry is a 30 y.o. female who presents to the Emergency Department complaining of intermittent sore throat for the past 3 days. Pain was initially worst in the morning and improved throughout the day. She reports feeling like there was a knot or "golf ball" stuck in her throat for 1-2 minutes intermittently throughout the day. Pt states she has been drinking tea but has not been able to eat secondary to pain. No alleviating factors noted. She took antibiotics ~1-2 months ago for a UTI and yeast infection; she notes frequent UTI's. Pt has been taking probiotics for the past 2 months. She reports accidental stick with used needle when buying clothes from [facility] in November 2016; she was seen in the ED following this incident and reports having a pelvic exam. She was not started on post-exposure drugs. Denies chest pain with eating or drinking.  History reviewed. No pertinent past medical history. Past Surgical History  Procedure Laterality Date  . Dilation and curettage of uterus     No family history on file. Social History  Substance Use Topics  . Smoking status: Current Every Day Smoker -- 0.00 packs/day    Types: Cigars  . Smokeless tobacco: None  . Alcohol Use: Yes     Comment: occ   OB History    No data available     Review of Systems  Constitutional: Positive for appetite change.  HENT: Positive for sore throat and trouble swallowing.   Cardiovascular: Negative for chest pain.  All other systems reviewed and are negative.   Allergies   Amoxicillin and Penicillins cross reactors  Home Medications   Prior to Admission medications   Medication Sig Start Date End Date Taking? Authorizing Provider  cephALEXin (KEFLEX) 500 MG capsule Take 1 capsule (500 mg total) by mouth 2 (two) times daily. 11/13/15   Garlon HatchetLisa M Sanders, PA-C  metroNIDAZOLE (FLAGYL) 500 MG tablet Take 1 tablet (500 mg total) by mouth 2 (two) times daily. 11/22/15   Stevi Barrett, PA-C  phenazopyridine (PYRIDIUM) 200 MG tablet Take 1 tablet (200 mg total) by mouth 3 (three) times daily. 11/13/15   Garlon HatchetLisa M Sanders, PA-C   BP 109/83 mmHg  Pulse 85  Temp(Src) 99.1 F (37.3 C) (Oral)  Resp 20  Ht 5\' 2"  (1.575 m)  Wt 112 lb (50.803 kg)  BMI 20.48 kg/m2  SpO2 100%  LMP 02/01/2016   Physical Exam  Constitutional: She is oriented to person, place, and time. She appears well-developed and well-nourished. No distress.  HENT:  Head: Normocephalic.  Mouth/Throat: Uvula is midline. No uvula swelling. Posterior oropharyngeal erythema present. No posterior oropharyngeal edema or tonsillar abscesses.  Small white patches over oropharynx diffusely  Eyes: Conjunctivae are normal.  Neck: Neck supple. No tracheal deviation present.  Cardiovascular: Normal rate and regular rhythm.   Pulmonary/Chest: Effort normal. No respiratory distress.  Abdominal: Soft. She exhibits no distension.  Neurological: She is alert and oriented to person, place, and time.  Skin: Skin is warm and dry.  Psychiatric: She has a  normal mood and affect.    ED Course  Procedures  DIAGNOSTIC STUDIES:  Oxygen Saturation is 100% on RA, normal by my interpretation.    COORDINATION OF CARE:  5:03 PM Will start on nystatin and test for HIV. Discussed treatment plan with pt at bedside and pt agreed to plan.  Labs Review Labs Reviewed  RAPID STREP SCREEN (NOT AT [facility])  CULTURE, GROUP A STREP [facility])  RAPID HIV SCREEN (HIV 1/2 AB+AG)    Imaging Review No results found. I have personally reviewed  and evaluated these images and lab results as part of my medical decision-making.   EKG Interpretation None      MDM   Final diagnoses:  Oropharyngeal candidiasis    30 year old female presents with clinical signs and symptoms of oropharyngeal candidiasis of unclear etiology. HIV testing that was repeated today is negative and I do not feel the patient is immunosuppressed. She did have some recent antibiotics and vaginal yeast infection following a urinary tract infection. Plan for treatment with nystatin swish and swallow and primary care follow-up.  I personally performed the services described in this documentation, which was scribed in my presence. The recorded information has been reviewed and is accurate.     Lyndal Pulley, MD 02/02/16 [phone]

## 2016-02-02 NOTE — Discharge Instructions (Signed)
Thrush, Adult  Thrush, also called oral candidiasis, is a fungal infection that develops in the mouth and throat and on the tongue. It causes white patches to form on the mouth and tongue. Thrush is most common in older adults, but it can occur at any age.   Many cases of thrush are mild, but this infection can also be more serious. Thrush can be a recurring problem for people who have chronic illnesses or who take medicines that limit the body's ability to fight infection. Because these people have difficulty fighting infections, the fungus that causes thrush can spread throughout the body. This can cause life-threatening blood or organ infections.  CAUSES   Thrush is usually caused by a yeast called Candida albicans. This fungus is normally present in small amounts in the mouth and on other mucous membranes. It usually causes no harm. However, when conditions are present that allow the fungus to grow uncontrolled, it invades surrounding tissues and becomes an infection. Less often, other Candida species can also lead to thrush.   RISK FACTORS  Thrush is more likely to develop in the following people:  · People with an impaired ability to fight infection (weakened immune system).    · Older adults.    · People with HIV.    · People with diabetes.    · People with dry mouth (xerostomia).    · Pregnant women.    · People with poor dental care, especially those who have false teeth.    · People who use antibiotic medicines.    SIGNS AND SYMPTOMS   Thrush can be a mild infection that causes no symptoms. If symptoms develop, they may include:   · A burning feeling in the mouth and throat. This can occur at the start of a thrush infection.    · White patches that adhere to the mouth and tongue. The tissue around the patches may be red, raw, and painful. If rubbed (during tooth brushing, for example), the patches and the tissue of the mouth may bleed easily.    · A bad taste in the mouth or difficulty tasting foods.     · Cottony feeling in the mouth.    · Pain during eating and swallowing.  DIAGNOSIS   Your health care provider can usually diagnose thrush by looking in your mouth and asking you questions about your health.   TREATMENT   Medicines that help prevent the growth of fungi (antifungals) are the standard treatment for thrush. These medicines are either applied directly to the affected area (topical) or swallowed (oral). The treatment will depend on the severity of the condition.   Mild Thrush  Mild cases of thrush may clear up with the use of an antifungal mouth rinse or lozenges. Treatment usually lasts about 14 days.   Moderate to Severe Thrush  · More severe thrush infections that have spread to the esophagus are treated with an oral antifungal medicine. A topical antifungal medicine may also be used.    · For some severe infections, a treatment period longer than 14 days may be needed.    · Oral antifungal medicines are almost never used during pregnancy because the fetus may be harmed. However, if a pregnant woman has a rare, severe thrush infection that has spread to her blood, oral antifungal medicines may be used. In this case, the risk of harm to the mother and fetus from the severe thrush infection may be greater than the risk posed by the use of antifungal medicines.    Persistent or Recurrent Thrush  For cases of   thrush that do not go away or keep coming back, treatment may involve the following:   · Treatment may be needed twice as long as the symptoms last.    · Treatment will include both oral and topical antifungal medicines.    · People with weakened immune systems can take an antifungal medicine on a continuous basis to prevent thrush infections.    It is important to treat conditions that make you more likely to get thrush, such as diabetes or HIV.   HOME CARE INSTRUCTIONS   · Only take over-the-counter or prescription medicine as directed by your health care provider. Talk to your health care  provider about an over-the-counter medicine called gentian violet, which kills bacteria and fungi.    · Eat plain, unflavored yogurt as directed by your health care provider. Check the label to make sure the yogurt contains live cultures. This yogurt can help healthy bacteria grow in the mouth that can stop the growth of the fungus that causes thrush.    · Try these measures to help reduce the discomfort of thrush:      Drink cold liquids such as water or iced tea.      Try flavored ice treats or frozen juices.      Eat foods that are easy to swallow, such as gelatin, ice cream, or custard.      If the patches in your mouth are painful, try drinking from a straw.    · Rinse your mouth several times a day with a warm saltwater rinse. You can make the saltwater mixture with 1 tsp (6 g) of salt in 8 fl oz (0.2 L) of warm water.    · If you wear dentures, remove the dentures before going to bed, brush them vigorously, and soak them in a cleaning solution as directed by your health care provider.    · Women who are breastfeeding should clean their nipples with an antifungal medicine as directed by their health care provider. Dry the nipples after breastfeeding. Applying lanolin-containing body lotion may help relieve nipple soreness.    SEEK MEDICAL CARE IF:  · Your symptoms are getting worse or are not improving within 7 days of starting treatment.    · You have symptoms of spreading infection, such as white patches on the skin outside of the mouth.    · You are nursing and you have redness, burning, or pain in the nipples that is not relieved with treatment.    MAKE SURE YOU:  · Understand these instructions.  · Will watch your condition.  · Will get help right away if you are not doing well or get worse.     This information is not intended to replace advice given to you by your health care provider. Make sure you discuss any questions you have with your health care provider.     Document Released: 04/27/2004 Document  Revised: 08/23/2014 Document Reviewed: 03/05/2013  Elsevier Interactive Patient Education ©2016 Elsevier Inc.

## 2016-02-02 NOTE — ED Notes (Signed)
Sore throat x3 days denies cough and is unsure of fever.

## 2016-02-05 LAB — CULTURE, GROUP A STREP (THRC)

## 2016-02-13 ENCOUNTER — Emergency Department (HOSPITAL_BASED_OUTPATIENT_CLINIC_OR_DEPARTMENT_OTHER)
Admission: EM | Admit: 2016-02-13 | Discharge: 2016-02-13 | Disposition: A | Discharge status: Home / Self Care | Payer: No Typology Code available for payment source | Attending: Emergency Medicine | Admitting: Emergency Medicine

## 2016-02-13 ENCOUNTER — Encounter (HOSPITAL_BASED_OUTPATIENT_CLINIC_OR_DEPARTMENT_OTHER): Payer: Self-pay | Admitting: *Deleted

## 2016-02-13 DIAGNOSIS — J029 Acute pharyngitis, unspecified: Secondary | ICD-10-CM | POA: Insufficient documentation

## 2016-02-13 DIAGNOSIS — F17210 Nicotine dependence, cigarettes, uncomplicated: Secondary | ICD-10-CM | POA: Insufficient documentation

## 2016-02-13 DIAGNOSIS — K0889 Other specified disorders of teeth and supporting structures: Secondary | ICD-10-CM | POA: Insufficient documentation

## 2016-02-13 MED ORDER — CLINDAMYCIN HCL 150 MG PO CAPS: 150.0000 mg | ORAL_CAPSULE | Freq: Four times a day (QID) | ORAL | Status: DC

## 2016-02-13 MED ORDER — NYSTATIN 100000 UNIT/ML MT SUSP: 500000.0000 [IU] | Freq: Four times a day (QID) | OROMUCOSAL | Status: DC

## 2016-02-13 MED FILL — NYSTATIN 100,000 UNITS/ML S: 100000 | 3 days supply | Qty: 60 | Fill #0

## 2016-02-13 MED FILL — CLINDAMYCIN HCL 150 MG CAP: 150 | 7 days supply | Qty: 28 | Fill #0

## 2016-02-13 NOTE — ED Notes (Signed)
States she was treated for thrush last week. She took all her medication with improvement. After the medication ran out the pain came back in her throat. Now her teeth also hurt.

## 2016-02-13 NOTE — ED Provider Notes (Signed)
CSN: [id number]     Arrival date & time 02/13/16  1048 History   First MD Initiated Contact with Patient 02/13/16 1109     Chief Complaint  Patient presents with  . Dental Pain     (Consider location/radiation/quality/duration/timing/severity/associated sxs/prior Treatment) HPI   30 year old female presenting for evaluation of sore throat. Patient reports she was treated for thrush last week. Her HIV test was negative. She was prescribed nystatin as treatment. She took the medication with improvement but now her medication ran out and the pain came back in her throat. She felt discomfort in the back of throat as well as tenderness to fatigue. She reportedly chipped one of her right lower tooth several weeks ago. She denies any significant pain initially but for the past several days she has noticed temperature sensitivity. Patient denies fever, runny nose, sneezing, coughing, chest pain, shortness of breath, or rash. Denies any prior history of STD. Patient states she smokes on occasion and drink on occasion. She does not have a dentist.  History reviewed. No pertinent past medical history. Past Surgical History  Procedure Laterality Date  . Dilation and curettage of uterus     No family history on file. Social History  Substance Use Topics  . Smoking status: Current Every Day Smoker -- 0.00 packs/day    Types: Cigars  . Smokeless tobacco: None  . Alcohol Use: Yes     Comment: occ   OB History    No data available     Review of Systems  All other systems reviewed and are negative.     Allergies  Amoxicillin and Penicillins cross reactors  Home Medications   Prior to Admission medications   Medication Sig Start Date End Date Taking? Authorizing Provider  cephALEXin (KEFLEX) 500 MG capsule Take 1 capsule (500 mg total) by mouth 2 (two) times daily. 11/13/15   Garlon HatchetLisa M Sanders, PA-C  metroNIDAZOLE (FLAGYL) 500 MG tablet Take 1 tablet (500 mg total) by mouth 2 (two) times  daily. 11/22/15   Stevi Barrett, PA-C  nystatin (MYCOSTATIN) 100000 UNIT/ML suspension Take 5 mLs (500,000 Units total) by mouth 4 (four) times daily. 02/02/16   Lyndal Pulleyaniel Knott, MD  phenazopyridine (PYRIDIUM) 200 MG tablet Take 1 tablet (200 mg total) by mouth 3 (three) times daily. 11/13/15   Garlon HatchetLisa M Sanders, PA-C   BP 124/86 mmHg  Pulse 93  Temp(Src) 98.2 F (36.8 C) (Oral)  Resp 20  Ht 5\' 2"  (1.575 m)  Wt 50.803 kg  BMI 20.48 kg/m2  SpO2 98%  LMP 02/01/2016 Physical Exam  Constitutional: She appears well-developed and well-nourished. No distress.  Caucasian female, nontoxic in appearance. Multiple tattoos and piercing throughout body.  HENT:  Head: Atraumatic.  Right Ear: External ear normal.  Left Ear: External ear normal.  Ears: Normal TMs bilaterally Nose: Normal nares Throat: Uvula is midline, posterior oropharyngeal erythema with mild tonsillar exudates but no tonsillar enlargement and no evidence of deep tissue infection. Tongue is mildly erythematous Teeth: Poor dentition, with dental decay noted to tooth #2 and 3 and moderate decay to tooth #29 along with tenderness to palpation but no trismus.  Eyes: Conjunctivae are normal.  Neck: Normal range of motion. Neck supple.  No nuchal rigidity  Cardiovascular: Normal rate and regular rhythm.   Pulmonary/Chest: Effort normal and breath sounds normal.  Abdominal: Soft. There is no tenderness.  Lymphadenopathy:    She has cervical adenopathy.  Neurological: She is alert.  Skin: No rash noted.  Psychiatric: She  has a normal mood and affect.  Nursing note and vitals reviewed.   ED Course  Procedures (including critical care time)   MDM   Final diagnoses:  Pain, dental  Pharyngitis    BP 124/86 mmHg  Pulse 93  Temp(Src) 98.2 F (36.8 C) (Oral)  Resp 20  Ht 5\' 2"  (1.575 m)  Wt 50.803 kg  BMI 20.48 kg/m2  SpO2 98%  LMP 02/01/2016   11:53 AM Patient was diagnosed with thrush recently improved with nystatin. She  states that her tongue has been white for a while but since taking the medication that has since mostly resolved. She requests for additional treatment of potential recurrent current so thrush. She also complaining of increased sore throat.  No drooling.  Doubt PTA or Ludwigs.  Also endorse dental pain, with evidence of widespread dental caries.  Will prescribe clindamycin, Nystatin and dental referral.  Return precaution given.  Doubt gonorrheal pharyngitis as pt has multiple STD testing in the past with negative results.    Fayrene HelperBowie Kinsley Nicklaus, PA-C 02/13/16 1203  Laurence Spatesachel Morgan Little, MD 02/13/16 [phone]

## 2016-02-13 NOTE — ED Notes (Signed)
PA at bedside.

## 2016-02-13 NOTE — Discharge Instructions (Signed)
Take antibiotic as treatment for pharyngitis and dental pain.  Use Nystatin mouth wash if your thrush return.  Follow up with dentist for further care.  Sore Throat A sore throat is a painful, burning, sore, or scratchy feeling of the throat. There may be pain or tenderness when swallowing or talking. You may have other symptoms with a sore throat. These include coughing, sneezing, fever, or a swollen neck. A sore throat is often the first sign of another sickness. These sicknesses may include a cold, flu, strep throat, or an infection called mono. Most sore throats go away without medical treatment.  HOME CARE   Only take medicine as told by your doctor.  Drink enough fluids to keep your pee (urine) clear or pale yellow.  Rest as needed.  Try using throat sprays, lozenges, or suck on hard candy (if older than 4 years or as told).  Sip warm liquids, such as broth, herbal tea, or warm water with honey. Try sucking on frozen ice pops or drinking cold liquids.  Rinse the mouth (gargle) with salt water. Mix 1 teaspoon salt with 8 ounces of water.  Do not smoke. Avoid being around others when they are smoking.  Put a humidifier in your bedroom at night to moisten the air. You can also turn on a hot shower and sit in the bathroom for 5-10 minutes. Be sure the bathroom door is closed. GET HELP RIGHT AWAY IF:   You have trouble breathing.  You cannot swallow fluids, soft foods, or your spit (saliva).  You have more puffiness (swelling) in the throat.  Your sore throat does not get better in 7 days.  You feel sick to your stomach (nauseous) and throw up (vomit).  You have a fever or lasting symptoms for more than 2-3 days.  You have a fever and your symptoms suddenly get worse. MAKE SURE YOU:   Understand these instructions.  Will watch your condition.  Will get help right away if you are not doing well or get worse.   This information is not intended to replace advice given to you  by your health care provider. Make sure you discuss any questions you have with your health care provider.   Document Released: 05/11/2008 Document Revised: 04/26/2012 Document Reviewed: 04/09/2012 Elsevier Interactive Patient Education [employer]2016 Elsevier Inc.

## 2016-05-14 ENCOUNTER — Emergency Department (HOSPITAL_BASED_OUTPATIENT_CLINIC_OR_DEPARTMENT_OTHER)
Admission: EM | Admit: 2016-05-14 | Discharge: 2016-05-14 | Disposition: A | Discharge status: Home / Self Care | Payer: Self-pay | Attending: Emergency Medicine | Admitting: Emergency Medicine

## 2016-05-14 ENCOUNTER — Encounter (HOSPITAL_BASED_OUTPATIENT_CLINIC_OR_DEPARTMENT_OTHER): Payer: Self-pay

## 2016-05-14 DIAGNOSIS — K029 Dental caries, unspecified: Secondary | ICD-10-CM | POA: Insufficient documentation

## 2016-05-14 DIAGNOSIS — K0889 Other specified disorders of teeth and supporting structures: Secondary | ICD-10-CM

## 2016-05-14 DIAGNOSIS — F17290 Nicotine dependence, other tobacco product, uncomplicated: Secondary | ICD-10-CM | POA: Insufficient documentation

## 2016-05-14 MED ORDER — FLUCONAZOLE 150 MG PO TABS: 150.0000 mg | ORAL_TABLET | Freq: Once | ORAL | 1 refills | Status: AC

## 2016-05-14 MED ORDER — CLINDAMYCIN HCL 300 MG PO CAPS: 300.0000 mg | ORAL_CAPSULE | Freq: Four times a day (QID) | ORAL | 0 refills | Status: DC

## 2016-05-14 MED ORDER — TRAMADOL HCL 50 MG PO TABS: 50.0000 mg | ORAL_TABLET | Freq: Four times a day (QID) | ORAL | 0 refills | Status: DC | PRN

## 2016-05-14 MED FILL — CLINDAMYCIN HCL 300 MG CAPS: 300 | 7 days supply | Qty: 28 | Fill #0

## 2016-05-14 MED FILL — traMADol HCL 50 MG TABS: 50 | 3 days supply | Qty: 15 | Fill #0

## 2016-05-14 MED FILL — FLUCONAZOLE 150 MG TABLET: 150 | 1 days supply | Qty: 1 | Fill #0

## 2016-05-14 NOTE — Discharge Instructions (Signed)
Clindamycin as prescribed.  Tramadol as prescribed as needed for pain.  Follow-up with dentistry if not improving in the next several days.

## 2016-05-14 NOTE — ED Provider Notes (Signed)
[facility]   CSN: [id number] Arrival date & time: 05/14/16  1315     History   Chief Complaint Chief Complaint  Patient presents with  . Dental Pain    HPI Savannah Perry is a 30 y.o. female.  Patient is a 30 year old female who presents with complaints of dental pain. She has several teeth which have been hurting. She reports swelling to her left neck and face. She has pain with swallowing but denies any difficulty breathing. No fevers or chills.      History reviewed. No pertinent past medical history.  There are no active problems to display for this patient.   Past Surgical History:  Procedure Laterality Date  . DILATION AND CURETTAGE OF UTERUS      OB History    No data available       Home Medications    Prior to Admission medications   Medication Sig Start Date End Date Taking? Authorizing Provider  cephALEXin (KEFLEX) 500 MG capsule Take 1 capsule (500 mg total) by mouth 2 (two) times daily. 11/13/15   Garlon Hatchet, PA-C  clindamycin (CLEOCIN) 150 MG capsule Take 1 capsule (150 mg total) by mouth every 6 (six) hours. 02/13/16   Fayrene Helper, PA-C  metroNIDAZOLE (FLAGYL) 500 MG tablet Take 1 tablet (500 mg total) by mouth 2 (two) times daily. 11/22/15   Stevi Barrett, PA-C  nystatin (MYCOSTATIN) 100000 UNIT/ML suspension Take 5 mLs (500,000 Units total) by mouth 4 (four) times daily. 02/13/16   Fayrene Helper, PA-C  phenazopyridine (PYRIDIUM) 200 MG tablet Take 1 tablet (200 mg total) by mouth 3 (three) times daily. 11/13/15   Garlon Hatchet, PA-C    Family History No family history on file.  Social History Social History  Substance Use Topics  . Smoking status: Current Every Day Smoker    Packs/day: 0.00    Types: Cigars  . Smokeless tobacco: Never Used  . Alcohol use Yes     Comment: occ     Allergies   Amoxicillin and Penicillins cross reactors   Review of Systems Review of Systems  All other systems reviewed and are  negative.    Physical Exam Updated Vital Signs BP 121/91 (BP Location: Left Arm)   Pulse 84   Temp 99.5 F (37.5 C) (Oral)   Resp 16   Ht 5\' 2"  (1.575 m)   Wt 115 lb (52.2 kg)   LMP 05/10/2016   SpO2 100%   BMI 21.03 kg/m   Physical Exam  Constitutional: She appears well-developed and well-nourished. No distress.  HENT:  Head: Normocephalic and atraumatic.  Mouth/Throat: Oropharynx is clear and moist.  The left lower second molar and right lower second molar are both decayed with large caries. There is some surrounding gingival inflammation. There is a small palpable lymph node to the left anterior cervical region. There is no crepitus of the submental space. There is no trismus.  Neck: Normal range of motion. Neck supple.  Pulmonary/Chest: Effort normal.  Skin: She is not diaphoretic.  Nursing note and vitals reviewed.    ED Treatments / Results  Labs (all labs ordered are listed, but only abnormal results are displayed) Labs Reviewed - No data to display  EKG  EKG Interpretation None       Radiology No results found.  Procedures Procedures (including critical care time)  Medications Ordered in ED Medications - No data to display   Initial Impression / Assessment and Plan / ED  Course  I have reviewed the triage vital signs and the nursing notes.  Pertinent labs & imaging results that were available during my care of the patient were reviewed by me and considered in my medical decision making (see chart for details).  Clinical Course    Dental pain. She will be treated with clindamycin, tramadol, and follow-up with dentistry if not improving.   Final Clinical Impressions(s) / ED Diagnoses   Final diagnoses:  None    New Prescriptions New Prescriptions   No medications on file     Geoffery Lyonsouglas Bristyl Mclees, MD 05/14/16 1338

## 2016-05-14 NOTE — ED Triage Notes (Addendum)
C/o toothache x 2 x 2 weeks and sore throat-NAD-steady gait

## 2016-07-03 ENCOUNTER — Encounter (HOSPITAL_BASED_OUTPATIENT_CLINIC_OR_DEPARTMENT_OTHER): Payer: Self-pay | Admitting: Emergency Medicine

## 2016-07-03 ENCOUNTER — Emergency Department (HOSPITAL_BASED_OUTPATIENT_CLINIC_OR_DEPARTMENT_OTHER)
Admission: EM | Admit: 2016-07-03 | Discharge: 2016-07-03 | Disposition: A | Discharge status: Home / Self Care | Payer: Self-pay | Attending: Emergency Medicine | Admitting: Emergency Medicine

## 2016-07-03 DIAGNOSIS — N390 Urinary tract infection, site not specified: Secondary | ICD-10-CM | POA: Insufficient documentation

## 2016-07-03 DIAGNOSIS — F17290 Nicotine dependence, other tobacco product, uncomplicated: Secondary | ICD-10-CM | POA: Insufficient documentation

## 2016-07-03 DIAGNOSIS — R21 Rash and other nonspecific skin eruption: Secondary | ICD-10-CM | POA: Insufficient documentation

## 2016-07-03 LAB — URINALYSIS, ROUTINE W REFLEX MICROSCOPIC
BILIRUBIN URINE: NEGATIVE
Glucose, UA: NEGATIVE mg/dL
KETONES UR: NEGATIVE mg/dL
NITRITE: POSITIVE — AB
PH: 5.5 (ref 5.0–8.0)
Protein, ur: NEGATIVE mg/dL
Specific Gravity, Urine: 1.013 (ref 1.005–1.030)

## 2016-07-03 LAB — URINE MICROSCOPIC-ADD ON

## 2016-07-03 LAB — PREGNANCY, URINE: Preg Test, Ur: NEGATIVE

## 2016-07-03 MED ORDER — NITROFURANTOIN MONOHYD MACRO 100 MG PO CAPS
100.0000 mg | ORAL_CAPSULE | Freq: Once | ORAL | Status: AC
  Administered 2016-07-03: 100 mg via ORAL
  Filled 2016-07-03: qty 1

## 2016-07-03 MED ORDER — NITROFURANTOIN MONOHYD MACRO 100 MG PO CAPS: 100.0000 mg | ORAL_CAPSULE | Freq: Two times a day (BID) | ORAL | 0 refills | Status: DC

## 2016-07-03 NOTE — ED Provider Notes (Signed)
[facility] Provider Note: Savannah DellJ. Lane Jarrius Huaracha, MD, FACEP  CSN: [id number] MRN: [id number] ARRIVAL: 07/03/16 at 0007 ROOM: [facility]01/[facility]01   CHIEF COMPLAINT  Dysuria   HISTORY OF PRESENT ILLNESS  Ky BarbanJamie Perry is a 30 y.o. female with a one-day history of burning with urination. She is also having urinary frequency and voiding small amounts. Her symptoms are moderate and consistent with previous urinary tract infections. She has been taking over-the-counter Azo with relief. She denies fever, chills, nausea, vomiting or diarrhea. She is up about one week late on her menses but a home pregnancy test yesterday was negative. She has also had a rash for about a week. The rash is maculopapular, pruritic and located on her trunk and proximal extremities. She does not know what triggered this rash. She has been taking Benadryl without adequate relief.   History reviewed. No pertinent past medical history.  Past Surgical History:  Procedure Laterality Date  . DILATION AND CURETTAGE OF UTERUS      No family history on file.  Social History  Substance Use Topics  . Smoking status: Current Every Day Smoker    Packs/day: 0.00    Types: Cigars  . Smokeless tobacco: Never Used  . Alcohol use Yes     Comment: occ    Prior to Admission medications   Medication Sig Start Date End Date Taking? Authorizing Provider  nitrofurantoin, macrocrystal-monohydrate, (MACROBID) 100 MG capsule Take 1 capsule (100 mg total) by mouth 2 (two) times daily. X 7 days 07/03/16   Paula LibraJohn Venora Kautzman, MD    Allergies Amoxicillin and Penicillins cross reactors   REVIEW OF SYSTEMS  Negative except as noted here or in the History of Present Illness.   PHYSICAL EXAMINATION  Initial Vital Signs Blood pressure 138/88, pulse 98, temperature 98.5 F (36.9 C), temperature source Oral, resp. rate 18, height 5\' 2"  (1.575 m), weight 115 lb (52.2 kg), last menstrual period 06/04/2016, SpO2 100 %.  Examination General:  Well-developed, well-nourished female in no acute distress; appearance consistent with age of record HENT: normocephalic; atraumatic Eyes: pupils equal, round and reactive to light; extraocular muscles intact Neck: supple Heart: regular rate and rhythm Lungs: clear to auscultation bilaterally Abdomen: soft; nondistended; mild suprapubic tenderness; no masses or hepatosplenomegaly; bowel sounds present GU: No CVA tenderness Extremities: No deformity; full range of motion; pulses normal Neurologic: Awake, alert and oriented; motor function intact in all extremities and symmetric; no facial droop Skin: Warm and dry; maculopapular rash of the trunk and neck with sparing of the distal extremities Psychiatric: Normal mood and affect   RESULTS  Summary of this visit's results, reviewed by myself:   EKG Interpretation  Date/Time:    Ventricular Rate:    PR Interval:    QRS Duration:   QT Interval:    QTC Calculation:   R Axis:     Text Interpretation:        Laboratory Studies: Results for orders placed or performed during the hospital encounter of 07/03/16 (from the past 24 hour(s))  Urinalysis, Routine w reflex microscopic ([facility])     Status: Abnormal   Collection Time: 07/03/16 12:28 AM  Result Value Ref Range   Color, Urine ORANGE (A) YELLOW   APPearance CLOUDY (A) CLEAR   Specific Gravity, Urine 1.013 1.005 - 1.030   pH 5.5 5.0 - 8.0   Glucose, UA NEGATIVE NEGATIVE mg/dL   Hgb urine dipstick LARGE (A) NEGATIVE   Bilirubin Urine NEGATIVE NEGATIVE   Ketones, ur  NEGATIVE NEGATIVE mg/dL   Protein, ur NEGATIVE NEGATIVE mg/dL   Nitrite POSITIVE (A) NEGATIVE   Leukocytes, UA MODERATE (A) NEGATIVE  Pregnancy, urine     Status: None   Collection Time: 07/03/16 12:28 AM  Result Value Ref Range   Preg Test, Ur NEGATIVE NEGATIVE  Urine microscopic-add on     Status: Abnormal   Collection Time: 07/03/16 12:28 AM  Result Value Ref Range   Squamous Epithelial / LPF 0-5 (A)  NONE SEEN   WBC, UA 6-30 0 - 5 WBC/hpf   RBC / HPF 6-30 0 - 5 RBC/hpf   Bacteria, UA FEW (A) NONE SEEN   Imaging Studies: No results found.  ED COURSE  Nursing notes and initial vitals signs, including pulse oximetry, reviewed.  Vitals:   07/03/16 0025 07/03/16 0026  BP: 138/88   Pulse: 98   Resp: 18   Temp: 98.5 F (36.9 C)   TempSrc: Oral   SpO2: 100%   Weight:  115 lb (52.2 kg)  Height:  5\' 2"  (1.575 m)   The rash has an appearance consistent with pityriasis rosea but no herald patch is seen. Patient advised to continue Benadryl. She may wish to follow-up with a dermatologist; we have done of dermatologist on call for the ED.  PROCEDURES    ED DIAGNOSES     ICD-9-CM ICD-10-CM   1. Lower urinary tract infectious disease 599.0 N39.0   2. Rash 782.1 R21        Paula LibraJohn Johnda Billiot, MD 07/03/16 [phone]

## 2016-07-03 NOTE — ED Triage Notes (Signed)
Pt reports pain with urination starting this morning. Also has rash on torso x several days.

## 2016-07-05 LAB — URINE CULTURE: Culture: 60000 — AB

## 2016-07-06 ENCOUNTER — Telehealth (HOSPITAL_BASED_OUTPATIENT_CLINIC_OR_DEPARTMENT_OTHER): Payer: Self-pay | Admitting: Emergency Medicine

## 2016-07-06 NOTE — Telephone Encounter (Signed)
Post ED Visit - Positive Culture Follow-up  Culture report reviewed by antimicrobial stewardship pharmacist:  []  Enzo BiNathan Batchelder, Pharm.D. []  Celedonio MiyamotoJeremy Frens, Pharm.D., BCPS []  Garvin FilaMike Maccia, Pharm.D. []  Georgina PillionElizabeth Martin, Pharm.D., BCPS []  [place]h Pham, [address]Pharm[address]D., BCPS, AAHIVP []  Estella HuskMichelle Turner, Pharm.D., BCPS, AAHIVP []  Tennis Mustassie Stewart, Pharm.D. []  Sherle Poeob Vincent, [address]Pharm[address] Apryl Dareen PianoAnderson PharmD  Positive urine culture Treated with nitrofurantoin, organism sensitive to the same and no further patient follow-up is required at this time.  Berle MullMiller, Letitia Sabala 07/06/2016, 11:36 AM

## 2016-09-02 ENCOUNTER — Emergency Department (HOSPITAL_BASED_OUTPATIENT_CLINIC_OR_DEPARTMENT_OTHER)
Admission: EM | Admit: 2016-09-02 | Discharge: 2016-09-02 | Disposition: A | Discharge status: Home / Self Care | Payer: Self-pay | Attending: Emergency Medicine | Admitting: Emergency Medicine

## 2016-09-02 ENCOUNTER — Encounter (HOSPITAL_BASED_OUTPATIENT_CLINIC_OR_DEPARTMENT_OTHER): Payer: Self-pay

## 2016-09-02 DIAGNOSIS — F17290 Nicotine dependence, other tobacco product, uncomplicated: Secondary | ICD-10-CM | POA: Insufficient documentation

## 2016-09-02 DIAGNOSIS — N3001 Acute cystitis with hematuria: Secondary | ICD-10-CM | POA: Insufficient documentation

## 2016-09-02 LAB — URINALYSIS, MICROSCOPIC (REFLEX)

## 2016-09-02 LAB — URINALYSIS, ROUTINE W REFLEX MICROSCOPIC
Glucose, UA: NEGATIVE mg/dL
Ketones, ur: 15 mg/dL — AB
NITRITE: POSITIVE — AB
PROTEIN: 100 mg/dL — AB
SPECIFIC GRAVITY, URINE: 1.021 (ref 1.005–1.030)
pH: 5 (ref 5.0–8.0)

## 2016-09-02 LAB — PREGNANCY, URINE: PREG TEST UR: NEGATIVE

## 2016-09-02 MED ORDER — NITROFURANTOIN MONOHYD MACRO 100 MG PO CAPS: 100.0000 mg | ORAL_CAPSULE | Freq: Two times a day (BID) | ORAL | 0 refills | Status: DC

## 2016-09-02 MED FILL — NITROFURANTOIN MONO-MCR 100: 100 | 5 days supply | Qty: 10 | Fill #0

## 2016-09-02 NOTE — ED Provider Notes (Signed)
[facility]   CSN: [id number] Arrival date & time: 09/02/16  1100     History   Chief Complaint Chief Complaint  Patient presents with  . Dysuria    HPI Savannah Perry is a 31 y.o. female.  HPI   31 year old female presents today with complaints of urinary tract infection. Patient notes that yesterday she started having burning with urination, frequency. She notes approximately 4 urinary tract infections per year,is identical to previous. She denies any abdominal pain, flank pain, nausea vomiting or any other concerning signs or symptoms. Patient reports last UTI was resolved with Macrobid.  History reviewed. No pertinent past medical history.  There are no active problems to display for this patient.   Past Surgical History:  Procedure Laterality Date  . DILATION AND CURETTAGE OF UTERUS      OB History    Gravida Para Term Preterm AB Living   1             SAB TAB Ectopic Multiple Live Births                   Home Medications    Prior to Admission medications   Medication Sig Start Date End Date Taking? Authorizing Provider  nitrofurantoin, macrocrystal-monohydrate, (MACROBID) 100 MG capsule Take 1 capsule (100 mg total) by mouth 2 (two) times daily. 09/02/16   Eyvonne MechanicJeffrey Valiant Dills, PA-C    Family History No family history on file.  Social History Social History  Substance Use Topics  . Smoking status: Current Every Day Smoker    Packs/day: 0.00    Types: Cigars  . Smokeless tobacco: Never Used  . Alcohol use Yes     Comment: occ     Allergies   Amoxicillin and Penicillins cross reactors   Review of Systems Review of Systems  All other systems reviewed and are negative.    Physical Exam Updated Vital Signs BP 117/70 (BP Location: Left Arm)   Pulse 98   Temp 98.2 F (36.8 C) (Oral)   Resp 20   LMP 08/25/2016   SpO2 98%   Breastfeeding? Unknown   Physical Exam  Constitutional: She is oriented to person, place, and  time. She appears well-developed and well-nourished.  HENT:  Head: Normocephalic and atraumatic.  Eyes: Conjunctivae are normal. Pupils are equal, round, and reactive to light. Right eye exhibits no discharge. Left eye exhibits no discharge. No scleral icterus.  Neck: Normal range of motion. No JVD present. No tracheal deviation present.  Pulmonary/Chest: Effort normal. No stridor.  Abdominal: Soft. She exhibits no distension and no mass. There is no tenderness. There is no rebound and no guarding. No hernia.  Neurological: She is alert and oriented to person, place, and time. Coordination normal.  Psychiatric: She has a normal mood and affect. Her behavior is normal. Judgment and thought content normal.  Nursing note and vitals reviewed.    ED Treatments / Results  Labs (all labs ordered are listed, but only abnormal results are displayed) Labs Reviewed  URINALYSIS, ROUTINE W REFLEX MICROSCOPIC - Abnormal; Notable for the following:       Result Value   Color, Urine ORANGE (*)    APPearance TURBID (*)    Hgb urine dipstick LARGE (*)    Bilirubin Urine MODERATE (*)    Ketones, ur 15 (*)    Protein, ur 100 (*)    Nitrite POSITIVE (*)    Leukocytes, UA LARGE (*)    All other components  within normal limits  URINALYSIS, MICROSCOPIC (REFLEX) - Abnormal; Notable for the following:    Bacteria, UA MANY (*)    Squamous Epithelial / LPF 6-30 (*)    All other components within normal limits  PREGNANCY, URINE    EKG  EKG Interpretation None       Radiology No results found.  Procedures Procedures (including critical care time)  Medications Ordered in ED Medications - No data to display   Initial Impression / Assessment and Plan / ED Course  I have reviewed the triage vital signs and the nursing notes.  Pertinent labs & imaging results that were available during my care of the patient were reviewed by me and considered in my medical decision making (see chart for  details).     Labs:  Imaging:  Consults:  Therapeutics:  Discharge Meds: Macrobid  Assessment/Plan:Patient presents with urinary tract infection. Afebrile nontoxic in no acute distress. History of same treated with Macrobid.  Most recent urine culture shows pansensitive.    Final Clinical Impressions(s) / ED Diagnoses   Final diagnoses:  Acute cystitis with hematuria    New Prescriptions Discharge Medication List as of 09/02/2016 11:53 AM    START taking these medications   Details  nitrofurantoin, macrocrystal-monohydrate, (MACROBID) 100 MG capsule Take 1 capsule (100 mg total) by mouth 2 (two) times daily., Starting Thu 09/02/2016, Print         [employer], PA-C 09/02/16 1655    Geoffery Lyons, MD 09/03/16 [phone]

## 2016-09-02 NOTE — ED Triage Notes (Signed)
C/o dysuria x 2 days-NAD-steady gait 

## 2016-09-02 NOTE — Discharge Instructions (Signed)
Please read attached information. If you experience any new or worsening signs or symptoms please return to the emergency room for evaluation. Please follow-up with your primary care provider or specialist as discussed. Please use medication prescribed only as directed and discontinue taking if you have any concerning signs or symptoms.   °

## 2017-01-05 MED FILL — FLUCONAZOLE 150 MG TABLET: 150 | 1 days supply | Qty: 1 | Fill #1

## 2017-01-16 ENCOUNTER — Encounter (HOSPITAL_BASED_OUTPATIENT_CLINIC_OR_DEPARTMENT_OTHER): Payer: Self-pay

## 2017-01-16 ENCOUNTER — Emergency Department (HOSPITAL_BASED_OUTPATIENT_CLINIC_OR_DEPARTMENT_OTHER)
Admission: EM | Admit: 2017-01-16 | Discharge: 2017-01-16 | Disposition: A | Discharge status: Home / Self Care | Payer: Self-pay | Attending: Emergency Medicine | Admitting: Emergency Medicine

## 2017-01-16 DIAGNOSIS — K0889 Other specified disorders of teeth and supporting structures: Secondary | ICD-10-CM | POA: Insufficient documentation

## 2017-01-16 DIAGNOSIS — F17290 Nicotine dependence, other tobacco product, uncomplicated: Secondary | ICD-10-CM | POA: Insufficient documentation

## 2017-01-16 DIAGNOSIS — L03211 Cellulitis of face: Secondary | ICD-10-CM | POA: Insufficient documentation

## 2017-01-16 MED ORDER — CLINDAMYCIN HCL 150 MG PO CAPS
450.0000 mg | ORAL_CAPSULE | Freq: Once | ORAL | Status: AC
  Administered 2017-01-16: 450 mg via ORAL
  Filled 2017-01-16: qty 3

## 2017-01-16 MED ORDER — ACETAMINOPHEN 500 MG PO TABS
1000.0000 mg | ORAL_TABLET | Freq: Once | ORAL | Status: AC
  Administered 2017-01-16: 1000 mg via ORAL
  Filled 2017-01-16: qty 2

## 2017-01-16 MED ORDER — CLINDAMYCIN HCL 150 MG PO CAPS: 450.0000 mg | ORAL_CAPSULE | Freq: Four times a day (QID) | ORAL | 0 refills | Status: DC

## 2017-01-16 NOTE — ED Triage Notes (Signed)
Right lower dental abscess, swelling since yesterday. States ibuprofen and ice ineffective.

## 2017-01-16 NOTE — Discharge Instructions (Signed)
You have a cracked tooth which is likely causing some of your pain. Please follow-up with your dentist as soon as possible for further evaluation and treatment. Your right lower face is also swollen, I suspected that you have a mild superficial infection which is called cellulitis. The most likely source is a bacteria from your mouth and/or teeth. Please take antibiotics as prescribed. Take Tylenol and/or ibuprofen for pain.

## 2017-01-16 NOTE — ED Provider Notes (Signed)
[facility]   CSN: [id number] Arrival date & time: 01/16/17  1558  By signing my name below, I, Deland Pretty, attest that this documentation has been prepared under the direction and in the presence of Sharen Heck, PA-C. Electronically Signed: Deland Pretty, ED Scribe. 01/16/17. 7:45 PM.  History   Chief Complaint Chief Complaint  Patient presents with  . Dental Pain   The history is provided by the patient. No language interpreter was used.   HPI Comments: Savannah Perry is a 31 y.o. female who presents to the Emergency Department complaining of severe, gradually worsening right lower dental pain with associated facial swelling and subjective fever that began yesterday. She reports that the tooth is cracked and believes it is infected. The pt states that she has plans to go to her dentist tomorrow. She additionally reports that she was seen by her dentist [facility]) and had another tooth extracted two weeks ago. No trismus, drooling, voice changes, neck stiffness.   History reviewed. No pertinent past medical history.  There are no active problems to display for this patient.   Past Surgical History:  Procedure Laterality Date  . DILATION AND CURETTAGE OF UTERUS      OB History    Gravida Para Term Preterm AB Living   1             SAB TAB Ectopic Multiple Live Births                   Home Medications    Prior to Admission medications   Medication Sig Start Date End Date Taking? Authorizing Provider  clindamycin (CLEOCIN) 150 MG capsule Take 3 capsules (450 mg total) by mouth every 6 (six) hours. 01/16/17   Liberty Handy, PA-C  nitrofurantoin, macrocrystal-monohydrate, (MACROBID) 100 MG capsule Take 1 capsule (100 mg total) by mouth 2 (two) times daily. 09/02/16   Eyvonne Mechanic, PA-C    Family History History reviewed. No pertinent family history.  Social History Social History  Substance Use Topics  . Smoking  status: Current Every Day Smoker    Packs/day: 0.00    Types: Cigars  . Smokeless tobacco: Never Used  . Alcohol use Yes     Comment: occ     Allergies   Amoxicillin and Penicillins cross reactors   Review of Systems Review of Systems  Constitutional: Positive for fever (subjective).  HENT: Positive for dental problem and facial swelling.      Physical Exam Updated Vital Signs BP 121/82 (BP Location: Left Arm)   Pulse 89   Temp 98.8 F (37.1 C) (Oral)   Resp 18   Ht 5\' 2"  (1.575 m)   Wt 52.2 kg (115 lb)   LMP 01/03/2017   SpO2 99%   BMI 21.03 kg/m   Physical Exam  Constitutional: She is oriented to person, place, and time. She appears well-developed and well-nourished. No distress.  NAD.  HENT:  Head: Normocephalic and atraumatic.  Right Ear: External ear normal.  Left Ear: External ear normal.  Nose: Nose normal.  Mouth/Throat: Abnormal dentition. No dental abscesses.  Tooth #30 is halfway cracked with gingival erythema and tenderness, no significant fluctuance surrounding this tooth.  Mild right sided lower jaw tenderness and edema and erythema, no fluctuance Poor dentition.  No anterior neck edema, erythema or erythema. No sublingual edema or tenderness.  Soft palate flat without tenderness.  No trismus.   No pooling of oral secretions.  Phonation normal, no  hot potato voice.  Maxilla and mandible nontender. Mastoids without edema, erythema or tenderness.    Eyes: Conjunctivae and EOM are normal. No scleral icterus.  Neck: Normal range of motion. Neck supple.  Cardiovascular: Normal rate, regular rhythm and normal heart sounds.   No murmur heard. Pulmonary/Chest: Effort normal and breath sounds normal. She has no wheezes.  Musculoskeletal: Normal range of motion. She exhibits no deformity.  Neurological: She is alert and oriented to person, place, and time.  Skin: Skin is warm and dry. Capillary refill takes less than 2 seconds.  Psychiatric: She has a  normal mood and affect. Her behavior is normal. Judgment and thought content normal.  Nursing note and vitals reviewed.    ED Treatments / Results   DIAGNOSTIC STUDIES: Oxygen Saturation is 99% on RA, normal by my interpretation.   COORDINATION OF CARE: 7:33 PM-Discussed next steps with pt. Pt verbalized understanding and is agreeable with the plan.   Labs (all labs ordered are listed, but only abnormal results are displayed) Labs Reviewed - No data to display  EKG  EKG Interpretation None       Radiology No results found.  Procedures Procedures (including critical care time)  Medications Ordered in ED Medications  clindamycin (CLEOCIN) capsule 450 mg (not administered)  acetaminophen (TYLENOL) tablet 1,000 mg (not administered)     Initial Impression / Assessment and Plan / ED Course  I have reviewed the triage vital signs and the nursing notes.  Pertinent labs & imaging results that were available during my care of the patient were reviewed by me and considered in my medical decision making (see chart for details).      Dental pain associated with broken tooth but no signs or symptoms of dental abscess on exam with patient afebrile, non toxic appearing, swallowing secretions well without hot potato voice. Exam unconcerning for Ludwig's angina or other deep tissue infection in neck. Patient does have mild/moderate right lower jaw edema, erythema and tenderness. Concerned for facial cellulitis with dental source.  Patient has had multiple dental extractions due to infection, she has very poor dentition and is higher risk given previous teeth infections.  Will treat with antibiotic and high dose NSAIDs. Urged patient to follow-up with dentist, she has dentist care established and plans to go tomorrow morning.  Strict ED return precautions given. Pt is aware of red flag symptoms that would warrant return to ED for re-evaluation and further treatment. Patient voices  understanding and is agreeable to plan.   Final Clinical Impressions(s) / ED Diagnoses   Final diagnoses:  Pain, dental  Facial cellulitis    New Prescriptions New Prescriptions   CLINDAMYCIN (CLEOCIN) 150 MG CAPSULE    Take 3 capsules (450 mg total) by mouth every 6 (six) hours.   I personally performed the services described in this documentation, which was scribed in my presence. The recorded information has been reviewed and is accurate.     Liberty HandyGibbons, Cherika Jessie J, PA-C 01/16/17 1945    Tegeler, Canary Brimhristopher J, MD 01/17/17 [phone]

## 2017-02-05 ENCOUNTER — Emergency Department (HOSPITAL_BASED_OUTPATIENT_CLINIC_OR_DEPARTMENT_OTHER)
Admission: EM | Admit: 2017-02-05 | Discharge: 2017-02-05 | Disposition: A | Discharge status: Home / Self Care | Payer: Self-pay | Attending: Emergency Medicine | Admitting: Emergency Medicine

## 2017-02-05 ENCOUNTER — Encounter (HOSPITAL_BASED_OUTPATIENT_CLINIC_OR_DEPARTMENT_OTHER): Payer: Self-pay

## 2017-02-05 DIAGNOSIS — N3000 Acute cystitis without hematuria: Secondary | ICD-10-CM | POA: Insufficient documentation

## 2017-02-05 DIAGNOSIS — F17290 Nicotine dependence, other tobacco product, uncomplicated: Secondary | ICD-10-CM | POA: Insufficient documentation

## 2017-02-05 LAB — URINALYSIS, ROUTINE W REFLEX MICROSCOPIC
Bilirubin Urine: NEGATIVE
GLUCOSE, UA: NEGATIVE mg/dL
Ketones, ur: NEGATIVE mg/dL
Nitrite: NEGATIVE
Protein, ur: NEGATIVE mg/dL
SPECIFIC GRAVITY, URINE: 1.011 (ref 1.005–1.030)
pH: 7 (ref 5.0–8.0)

## 2017-02-05 LAB — URINALYSIS, MICROSCOPIC (REFLEX)

## 2017-02-05 MED ORDER — NITROFURANTOIN MONOHYD MACRO 100 MG PO CAPS: 100.0000 mg | ORAL_CAPSULE | Freq: Two times a day (BID) | ORAL | 0 refills | Status: DC

## 2017-02-05 NOTE — Discharge Instructions (Signed)
Please read and follow all provided instructions.  Your diagnoses today include:  1. Acute cystitis without hematuria    Tests performed today include:  Urine test - suggests that you have an infection in your bladder  Vital signs. See below for your results today.   Medications prescribed:   Macrobid - antibiotic for urinary tract infection  You have been prescribed an antibiotic medicine: take the entire course of medicine even if you are feeling better. Stopping early can cause the antibiotic not to work.  Home care instructions:  Follow any educational materials contained in this packet.  Follow-up instructions: Please follow-up with your primary care provider in 3 days if symptoms are not resolved for further evaluation of your symptoms.  Return instructions:   Please return to the Emergency Department if you experience worsening symptoms.   Return with fever, worsening pain, persistent vomiting, worsening pain in your back.   Please return if you have any other emergent concerns.  Additional Information:  Your vital signs today were: BP (!) 145/98 (BP Location: Right Arm)    Pulse 96    Temp 98.3 F (36.8 C) (Oral)    Resp 17    Ht 5\' 2"  (1.575 m)    Wt 52.2 kg (115 lb)    LMP 01/26/2017 (Exact Date)    SpO2 100%    BMI 21.03 kg/m  If your blood pressure (BP) was elevated above 135/85 this visit, please have this repeated by your doctor within one month. --------------

## 2017-02-05 NOTE — ED Provider Notes (Signed)
[facility] Provider Note   CSN: [id number] Arrival date & time: 02/05/17  1300     History   Chief Complaint Chief Complaint  Patient presents with  . Urinary Tract Infection    HPI Savannah Perry is a 31 y.o. female.  Patient presents with complaint of urinary tract infection symptoms for the past 4 days. She has a history of UTI. She complains of increased frequency and urgency with lower abdominal cramps. No vaginal symptoms. She's been taking Azo with some relief. No fevers, back pain, vomiting. Patient has taken Macrobid in the past with improvement. The onset of this condition was acute. The course is constant.        History reviewed. No pertinent past medical history.  There are no active problems to display for this patient.   Past Surgical History:  Procedure Laterality Date  . DILATION AND CURETTAGE OF UTERUS      OB History    Gravida Para Term Preterm AB Living   1             SAB TAB Ectopic Multiple Live Births                   Home Medications    Prior to Admission medications   Medication Sig Start Date End Date Taking? Authorizing Provider  clindamycin (CLEOCIN) 150 MG capsule Take 3 capsules (450 mg total) by mouth every 6 (six) hours. 01/16/17   Liberty Handy, PA-C  nitrofurantoin, macrocrystal-monohydrate, (MACROBID) 100 MG capsule Take 1 capsule (100 mg total) by mouth 2 (two) times daily. 02/05/17   Renne Crigler, PA-C    Family History No family history on file.  Social History Social History  Substance Use Topics  . Smoking status: Current Every Day Smoker    Packs/day: 0.00    Types: Cigars  . Smokeless tobacco: Never Used  . Alcohol use Yes     Comment: occ     Allergies   Amoxicillin and Penicillins cross reactors   Review of Systems Review of Systems  Constitutional: Negative for fever.  Gastrointestinal: Negative for nausea and vomiting.  Genitourinary: Positive for dysuria and urgency. Negative  for flank pain, hematuria, vaginal bleeding and vaginal discharge.     Physical Exam Updated Vital Signs BP (!) 145/98 (BP Location: Right Arm)   Pulse 96   Temp 98.3 F (36.8 C) (Oral)   Resp 17   Ht 5\' 2"  (1.575 m)   Wt 52.2 kg (115 lb)   LMP 01/26/2017 (Exact Date)   SpO2 100%   BMI 21.03 kg/m   Physical Exam  Constitutional: She appears well-developed and well-nourished.  HENT:  Head: Normocephalic and atraumatic.  Eyes: Conjunctivae are normal.  Neck: Normal range of motion. Neck supple.  Pulmonary/Chest: No respiratory distress.  Abdominal: Soft. Bowel sounds are normal. There is tenderness. There is no rebound and no guarding.  Mild suprapubic tenderness  Neurological: She is alert.  Skin: Skin is warm and dry.  Psychiatric: She has a normal mood and affect.  Nursing note and vitals reviewed.    ED Treatments / Results  Labs (all labs ordered are listed, but only abnormal results are displayed) Labs Reviewed  URINALYSIS, ROUTINE W REFLEX MICROSCOPIC - Abnormal; Notable for the following:       Result Value   Color, Urine AMBER (*)    APPearance CLOUDY (*)    Hgb urine dipstick LARGE (*)    Leukocytes, UA LARGE (*)  All other components within normal limits  URINALYSIS, MICROSCOPIC (REFLEX) - Abnormal; Notable for the following:    Bacteria, UA MANY (*)    Squamous Epithelial / LPF 6-30 (*)    All other components within normal limits  URINE CULTURE    Procedures Procedures (including critical care time)  Medications Ordered in ED Medications - No data to display   Initial Impression / Assessment and Plan / ED Course  I have reviewed the triage vital signs and the nursing notes.  Pertinent labs & imaging results that were available during my care of the patient were reviewed by me and considered in my medical decision making (see chart for details).     Patient seen and examined. UA suggestive of UTI. This is clinically consistent with her  complaint. Will treat for UTI.  Vital signs reviewed and are as follows: BP (!) 145/98 (BP Location: Right Arm)   Pulse 96   Temp 98.3 F (36.8 C) (Oral)   Resp 17   Ht 5\' 2"  (1.575 m)   Wt 52.2 kg (115 lb)   LMP 01/26/2017 (Exact Date)   SpO2 100%   BMI 21.03 kg/m    Final Clinical Impressions(s) / ED Diagnoses   Final diagnoses:  Acute cystitis without hematuria   Patient with clinical signs symptoms and UA consistent with acute cystitis. No signs of pyelonephritis.  New Prescriptions Current Discharge Medication List       Renne CriglerGeiple, Kyarah Enamorado, Cordelia Poche-C 02/05/17 1442    Alvira MondaySchlossman, Erin, MD 02/09/17 1407

## 2017-02-05 NOTE — ED Triage Notes (Signed)
Pt c/o burning with urination, urgency started 6/19. Pt states it feels like a UTI that she has had in past.

## 2017-02-06 LAB — URINE CULTURE: Culture: NO GROWTH

## 2017-05-17 ENCOUNTER — Emergency Department (HOSPITAL_BASED_OUTPATIENT_CLINIC_OR_DEPARTMENT_OTHER): Payer: Self-pay

## 2017-05-17 ENCOUNTER — Encounter (HOSPITAL_BASED_OUTPATIENT_CLINIC_OR_DEPARTMENT_OTHER): Payer: Self-pay

## 2017-05-17 NOTE — ED Triage Notes (Signed)
Pt c/o generalizes upper body pain that started in left chest over a week ago, has moved to bilateral lower chest and yesterday went into her back and neck, pain is worse with movement and coughing

## 2017-05-18 ENCOUNTER — Emergency Department (HOSPITAL_BASED_OUTPATIENT_CLINIC_OR_DEPARTMENT_OTHER)
Admission: EM | Admit: 2017-05-18 | Discharge: 2017-05-18 | Disposition: A | Discharge status: Home / Self Care | Payer: Self-pay | Attending: Emergency Medicine | Admitting: Emergency Medicine

## 2017-05-18 DIAGNOSIS — F17210 Nicotine dependence, cigarettes, uncomplicated: Secondary | ICD-10-CM | POA: Insufficient documentation

## 2017-05-18 DIAGNOSIS — R05 Cough: Secondary | ICD-10-CM | POA: Insufficient documentation

## 2017-05-18 DIAGNOSIS — Z79899 Other long term (current) drug therapy: Secondary | ICD-10-CM | POA: Insufficient documentation

## 2017-05-18 DIAGNOSIS — R0789 Other chest pain: Secondary | ICD-10-CM | POA: Insufficient documentation

## 2017-05-18 MED ORDER — NAPROXEN 250 MG PO TABS
500.0000 mg | ORAL_TABLET | Freq: Once | ORAL | Status: AC
  Administered 2017-05-18: 500 mg via ORAL
  Filled 2017-05-18: qty 2

## 2017-05-18 MED ORDER — NAPROXEN 375 MG PO TABS: ORAL_TABLET | ORAL | 0 refills | Status: DC

## 2017-05-18 NOTE — ED Notes (Signed)
ED Provider at bedside. 

## 2017-05-18 NOTE — ED Provider Notes (Signed)
[facility] Provider Note: [name], MD, FACEP  CSN: [id number] MRN: [id number] ARRIVAL: 05/18/17 at 0004 ROOM: MH09/MH09   CHIEF COMPLAINT  Chest Pain   HISTORY OF PRESENT ILLNESS  05/18/17 1:32 AM Savannah Perry is a 31 y.o. female with a two-week history of pain in her chest and upper back and neck which has worsened over the past 2 days. The pain is vaguely localized and is aching in nature. It occurs when she moves or takes a deep breath. It also occurs with coughing. She rates it as a 7 out of 10. She thinks the pain may have been brought on by a recent cough as well as overdoing it at work. Pain is not worse with palpation of her chest. She is not short of breath. She has not had fever or chills. She has not taken anything for her pain.   History reviewed. No pertinent past medical history.  Past Surgical History:  Procedure Laterality Date  . DILATION AND CURETTAGE OF UTERUS      No family history on file.  Social History  Substance Use Topics  . Smoking status: Current Every Day Smoker    Packs/day: 0.00    Types: Cigars  . Smokeless tobacco: Never Used  . Alcohol use Yes     Comment: occ    Prior to Admission medications   Medication Sig Start Date End Date Taking? Authorizing Provider  clindamycin (CLEOCIN) 150 MG capsule Take 3 capsules (450 mg total) by mouth every 6 (six) hours. 01/16/17   Liberty Handy, PA-C  nitrofurantoin, macrocrystal-monohydrate, (MACROBID) 100 MG capsule Take 1 capsule (100 mg total) by mouth 2 (two) times daily. 02/05/17   Renne Crigler, PA-C    Allergies Amoxicillin and Penicillins cross reactors   REVIEW OF SYSTEMS  Negative except as noted here or in the History of Present Illness.   PHYSICAL EXAMINATION  Initial Vital Signs Blood pressure 128/88, pulse 62, temperature 98.1 F (36.7 C), temperature source Oral, resp. rate 18, height  (1.575 m), weight 52.2 kg (115 lb), last menstrual period  05/03/2017, SpO2 100 %, unknown if currently breastfeeding.  Examination General: Well-developed, well-nourished female in no acute distress; appearance consistent with age of record HENT: normocephalic; atraumatic Eyes: pupils equal, round and reactive to light; extraocular muscles intact Neck: supple Heart: regular rate and rhythm Lungs: clear to auscultation bilaterally Chest: No chest wall tenderness anteriorly or posteriorly Abdomen: soft; nondistended; nontender; no masses or hepatosplenomegaly; bowel sounds present Extremities: No deformity; full range of motion Neurologic: Awake, alert and oriented; motor function intact in all extremities and symmetric; no facial droop Skin: Warm and dry Psychiatric: Normal mood and affect   RESULTS  Summary of this visit's results, reviewed by myself:   EKG Interpretation  Date/Time:    Ventricular Rate:    PR Interval:    QRS Duration:   QT Interval:    QTC Calculation:   R Axis:     Text Interpretation:        Laboratory Studies: No results found for this or any previous visit (from the past 24 hour(s)). Imaging Studies: Dg Chest 2 View  Result Date: 05/17/2017 CLINICAL DATA:  Upper body pain for 2 weeks. Shortness of breath and cough. Neck and back pain. Smoker. EXAM: CHEST  2 VIEW COMPARISON:  12/08/2014 FINDINGS: Hyperinflation. Normal heart size and pulmonary vascularity. No focal airspace disease or consolidation in the lungs. No blunting of costophrenic angles. No pneumothorax. Mediastinal contours  appear intact. IMPRESSION: No active cardiopulmonary disease. Electronically Signed   By: Burman Nieves M.D.   On: 05/17/2017 23:58    ED COURSE  Nursing notes and initial vitals signs, including pulse oximetry, reviewed.  Vitals:   05/17/17 2326 05/17/17 2327  BP: 128/88   Pulse: 62   Resp: 18   Temp: 98.1 F (36.7 C)   TempSrc: Oral   SpO2: 100%   Weight:  52.2 kg (115 lb)  Height:   (1.575 m)     PROCEDURES    ED DIAGNOSES     ICD-10-CM   1. Chest wall pain R07.89        Paula Libra, MD 05/18/17 [phone]

## 2017-05-18 NOTE — ED Notes (Signed)
Cough with bilateral lower chest and upper back pain. Productive cough.

## 2017-06-06 ENCOUNTER — Emergency Department (HOSPITAL_BASED_OUTPATIENT_CLINIC_OR_DEPARTMENT_OTHER): Payer: Self-pay

## 2017-06-06 ENCOUNTER — Encounter (HOSPITAL_BASED_OUTPATIENT_CLINIC_OR_DEPARTMENT_OTHER): Payer: Self-pay | Admitting: *Deleted

## 2017-06-06 ENCOUNTER — Emergency Department (HOSPITAL_BASED_OUTPATIENT_CLINIC_OR_DEPARTMENT_OTHER)
Admission: EM | Admit: 2017-06-06 | Discharge: 2017-06-06 | Disposition: A | Discharge status: Home / Self Care | Payer: Self-pay | Attending: Emergency Medicine | Admitting: Emergency Medicine

## 2017-06-06 DIAGNOSIS — M79622 Pain in left upper arm: Secondary | ICD-10-CM | POA: Insufficient documentation

## 2017-06-06 DIAGNOSIS — F17290 Nicotine dependence, other tobacco product, uncomplicated: Secondary | ICD-10-CM | POA: Insufficient documentation

## 2017-06-06 DIAGNOSIS — M79602 Pain in left arm: Secondary | ICD-10-CM

## 2017-06-06 DIAGNOSIS — M79601 Pain in right arm: Secondary | ICD-10-CM

## 2017-06-06 DIAGNOSIS — M79621 Pain in right upper arm: Secondary | ICD-10-CM | POA: Insufficient documentation

## 2017-06-06 DIAGNOSIS — R079 Chest pain, unspecified: Secondary | ICD-10-CM | POA: Insufficient documentation

## 2017-06-06 LAB — CBC WITH DIFFERENTIAL/PLATELET
BASOS ABS: 0.1 10*3/uL (ref 0.0–0.1)
Basophils Relative: 0 %
EOS ABS: 0.2 10*3/uL (ref 0.0–0.7)
Eosinophils Relative: 1 %
HCT: 45 % (ref 36.0–46.0)
Hemoglobin: 15.3 g/dL — ABNORMAL HIGH (ref 12.0–15.0)
LYMPHS PCT: 25 %
Lymphs Abs: 3.4 10*3/uL (ref 0.7–4.0)
MCH: 31.5 pg (ref 26.0–34.0)
MCHC: 34 g/dL (ref 30.0–36.0)
MCV: 92.6 fL (ref 78.0–100.0)
Monocytes Absolute: 0.6 10*3/uL (ref 0.1–1.0)
Monocytes Relative: 5 %
Neutro Abs: 9.4 10*3/uL — ABNORMAL HIGH (ref 1.7–7.7)
Neutrophils Relative %: 69 %
PLATELETS: 302 10*3/uL (ref 150–400)
RBC: 4.86 MIL/uL (ref 3.87–5.11)
RDW: 12.7 % (ref 11.5–15.5)
WBC: 13.6 10*3/uL — AB (ref 4.0–10.5)

## 2017-06-06 LAB — COMPREHENSIVE METABOLIC PANEL
ALT: 13 U/L — ABNORMAL LOW (ref 14–54)
AST: 16 U/L (ref 15–41)
Albumin: 4.7 g/dL (ref 3.5–5.0)
Alkaline Phosphatase: 48 U/L (ref 38–126)
Anion gap: 6 (ref 5–15)
BILIRUBIN TOTAL: 0.8 mg/dL (ref 0.3–1.2)
BUN: 7 mg/dL (ref 6–20)
CO2: 25 mmol/L (ref 22–32)
CREATININE: 0.79 mg/dL (ref 0.44–1.00)
Calcium: 9.3 mg/dL (ref 8.9–10.3)
Chloride: 107 mmol/L (ref 101–111)
Glucose, Bld: 96 mg/dL (ref 65–99)
POTASSIUM: 3.7 mmol/L (ref 3.5–5.1)
Sodium: 138 mmol/L (ref 135–145)
TOTAL PROTEIN: 7.1 g/dL (ref 6.5–8.1)

## 2017-06-06 LAB — PREGNANCY, URINE: PREG TEST UR: NEGATIVE

## 2017-06-06 MED ORDER — MELOXICAM 7.5 MG PO TABS: 7.5000 mg | ORAL_TABLET | Freq: Every day | ORAL | 0 refills | Status: AC

## 2017-06-06 NOTE — ED Triage Notes (Addendum)
Recheck chest wall pain and arm pain. Pain x 6 weeks. No relief with Naprosyn that we gave her before. She took it 3 weeks with no relief.

## 2017-06-06 NOTE — Discharge Instructions (Signed)
you make take tylenol, up to 1,000 mg (two extra strength pills).  Do not take more than 3,000 mg tylenol in a 24 hour period.  Please check all medication labels as many medications such as pain and cold medications may contain tylenol.  Do not drink alcohol while taking these medications.  Do not take other NSAID'S while taking mobic (such as aleve, motrin, advil or naproxen).  Please take ibuprofen with food to decrease stomach upset.

## 2017-06-06 NOTE — ED Notes (Signed)
ED Provider at bedside. 

## 2017-06-06 NOTE — ED Provider Notes (Signed)
[facility] EMERGENCY DEPARTMENT Provider Note   CSN: [id number] Arrival date & time: 06/06/17  1602     History   Chief Complaint Chief Complaint  Patient presents with  . Arm Pain    HPI Savannah Perry is a 31 y.o. female who presents today for evaluation of chest pain and pain in her bilateral upper arms. This pain has been present for about 6 weeks.  She was initially seen for this issue and it was felt to be MSK in  [place].  She reports that she took the 10 days of naproxen which did not help her pain or even take the edge off.  She reports that she smokes and has a cough a t baseline but that it is not worse than normal.  She denies any fevers or chills, no N/V/D.  Her pain is intermittent and moves all around her chest, back, and down her bilateral arms but not past the elbows.  She denies any tingling, weakness or numbness. No trauma prior to the start of her symptoms.  She denies any SOB.  She denies any injection drug use.  No abdominal pain.    HPI  History reviewed. No pertinent past medical history.  There are no active problems to display for this patient.   Past Surgical History:  Procedure Laterality Date  . DILATION AND CURETTAGE OF UTERUS      OB History    Gravida Para Term Preterm AB Living   1             SAB TAB Ectopic Multiple Live Births                   Home Medications    Prior to Admission medications   Medication Sig Start Date End Date Taking? Authorizing Provider  meloxicam (MOBIC) 7.5 MG tablet Take 1 tablet (7.5 mg total) by mouth daily. 06/06/17 06/16/17  Cristina Gong, PA-C  naproxen (NAPROSYN) 375 MG tablet Take one tablet twice daily as needed for chest wall pain. 05/18/17   Molpus, John, MD    Family History No family history on file.  Social History Social History  Substance Use Topics  . Smoking status: Current Every Day Smoker    Packs/day: 0.00    Types: Cigars  . Smokeless tobacco: Never Used  .  Alcohol use Yes     Comment: occ     Allergies   Amoxicillin and Penicillins cross reactors   Review of Systems Review of Systems  Constitutional: Negative for chills and fever.  HENT: Negative for ear pain and sore throat.   Eyes: Negative for pain and visual disturbance.  Respiratory: Positive for cough (Unchanged from baseline) and chest tightness. Negative for shortness of breath.   Cardiovascular: Negative for chest pain and palpitations.  Gastrointestinal: Negative for abdominal pain, nausea and vomiting.  Genitourinary: Negative for dysuria and hematuria.  Musculoskeletal: Negative for arthralgias and back pain.       Back stiffness, upper arm pain  Skin: Negative for color change and rash.  Neurological: Negative for seizures and syncope.  All other systems reviewed and are negative.    Physical Exam Updated Vital Signs BP 134/72   Pulse 74   Temp 98.7 F (37.1 C) (Oral)   Resp 18   Ht 5\' 2"  (1.575 m)   Wt 52.2 kg (115 lb)   LMP 06/05/2017   SpO2 99%   BMI 21.03 kg/m   Physical Exam  Constitutional: She  is oriented to person, place, and time. She appears well-developed and well-nourished. No distress.  HENT:  Head: Normocephalic and atraumatic.  Eyes: Conjunctivae are normal. Right eye exhibits no discharge. Left eye exhibits no discharge. No scleral icterus.  Neck: Normal range of motion. Neck supple.  Cardiovascular: Normal rate, regular rhythm, normal heart sounds and intact distal pulses.   No murmur heard. Pulmonary/Chest: Effort normal and breath sounds normal. No stridor. No respiratory distress. She has no wheezes. She exhibits tenderness (Chest is TTP, worst over left anterior chest).  Abdominal: Soft. Bowel sounds are normal. She exhibits no distension.  Musculoskeletal: She exhibits no edema or deformity.  There is muscular tenderness to palpation over posterior superior shoulder on the left side.  No midline spine tenderness through C, T, and  L-spine.   Neurological: She is alert and oriented to person, place, and time. She exhibits normal muscle tone.  Skin: Skin is warm and dry. She is not diaphoretic.  Psychiatric: She has a normal mood and affect. Her behavior is normal.  Nursing note and vitals reviewed.     ED Treatments / Results  Labs (all labs ordered are listed, but only abnormal results are displayed) Labs Reviewed  COMPREHENSIVE METABOLIC PANEL - Abnormal; Notable for the following:       Result Value   ALT 13 (*)    All other components within normal limits  CBC WITH DIFFERENTIAL/PLATELET - Abnormal; Notable for the following:    WBC 13.6 (*)    Hemoglobin 15.3 (*)    Neutro Abs 9.4 (*)    All other components within normal limits  PREGNANCY, URINE    EKG  EKG Interpretation  Date/Time:  Monday June 06 2017 19:34:01 EDT Ventricular Rate:  71 PR Interval:  124 QRS Duration: 76 QT Interval:  398 QTC Calculation: 432 R Axis:   87 Text Interpretation:  Normal sinus rhythm with sinus arrhythmia Normal ECG No old tracing to compare Confirmed by Linwood DibblesKnapp, Jon [phone]) on 06/06/2017 8:02:39 PM       Radiology No results found.  CLINICAL DATA:  31 year old female with history of left-sided chest pain for the past 6 weeks.  EXAM: CHEST  2 VIEW  COMPARISON:  Chest x-ray 05/17/2017.  FINDINGS: Lung volumes are normal. No consolidative airspace disease. No pleural effusions. No pneumothorax. No pulmonary nodule or mass noted. Pulmonary vasculature and the cardiomediastinal silhouette are within normal limits.  IMPRESSION: No radiographic evidence of acute cardiopulmonary disease.   Electronically Signed   By: Trudie Reedaniel  Entrikin M.D.   On: 06/06/2017 19:13  Procedures Procedures (including critical care time)  Medications Ordered in ED Medications - No data to display   Initial Impression / Assessment and Plan / ED Course  I have reviewed the triage vital signs and the nursing  notes.  Pertinent labs & imaging results that were available during my care of the patient were reviewed by me and considered in my medical decision making (see chart for details).  Ky BarbanJamie Laughridge presents for evaluation of approximately 6 weeks, intermittent, and pain.  Her pain is not consistent in location.  She previously tried naproxen without relief.  Labs were obtained without evidence of acute abnormalities.  Chest x-ray was obtained without obvious infiltrates or cause for her pain.  She does not have any midline spinal tenderness.  PE was considered, however patient is PERC negative, Wells DVT criteria negative.  Suspect that patient's pain is most likely musculoskeletal in nature, given the migratory history.  Given lack of improvement on naproxen will give patient prescription for meloxicam.  I instructed that she needs to obtain a primary care provider regarding this issue.     Final Clinical Impressions(s) / ED Diagnoses   Final diagnoses:  Chest pain, unspecified type  Right arm pain  Left arm pain    New Prescriptions Discharge Medication List as of 06/06/2017  8:03 PM    START taking these medications   Details  meloxicam (MOBIC) 7.5 MG tablet Take 1 tablet (7.5 mg total) by mouth daily., Starting Mon 06/06/2017, Until Thu 06/16/2017, Print         Cristina Gong, PA-C 06/08/17 Geryl Councilman, MD 06/09/17 [phone]

## 2017-09-21 ENCOUNTER — Encounter (HOSPITAL_COMMUNITY): Payer: Self-pay | Admitting: *Deleted

## 2017-09-21 ENCOUNTER — Other Ambulatory Visit (HOSPITAL_COMMUNITY): Payer: Self-pay | Admitting: *Deleted

## 2017-09-21 DIAGNOSIS — N6320 Unspecified lump in the left breast, unspecified quadrant: Secondary | ICD-10-CM

## 2017-10-06 ENCOUNTER — Encounter (HOSPITAL_COMMUNITY): Payer: Self-pay

## 2017-10-06 ENCOUNTER — Ambulatory Visit (HOSPITAL_COMMUNITY)
Admission: RE | Admit: 2017-10-06 | Discharge: 2017-10-06 | Disposition: A | Discharge status: Home / Self Care | Payer: Self-pay | Source: Ambulatory Visit | Attending: Obstetrics and Gynecology | Admitting: Obstetrics and Gynecology

## 2017-10-06 ENCOUNTER — Ambulatory Visit
Admission: RE | Admit: 2017-10-06 | Discharge: 2017-10-06 | Disposition: A | Discharge status: Home / Self Care | Payer: No Typology Code available for payment source | Source: Ambulatory Visit | Attending: Obstetrics and Gynecology | Admitting: Obstetrics and Gynecology

## 2017-10-06 ENCOUNTER — Other Ambulatory Visit (HOSPITAL_COMMUNITY): Payer: Self-pay | Admitting: *Deleted

## 2017-10-06 VITALS — BP 102/60 | Ht 62.0 in

## 2017-10-06 DIAGNOSIS — N6320 Unspecified lump in the left breast, unspecified quadrant: Secondary | ICD-10-CM

## 2017-10-06 DIAGNOSIS — N6311 Unspecified lump in the right breast, upper outer quadrant: Secondary | ICD-10-CM

## 2017-10-06 DIAGNOSIS — N6310 Unspecified lump in the right breast, unspecified quadrant: Secondary | ICD-10-CM

## 2017-10-06 DIAGNOSIS — N6324 Unspecified lump in the left breast, lower inner quadrant: Secondary | ICD-10-CM

## 2017-10-06 DIAGNOSIS — R87613 High grade squamous intraepithelial lesion on cytologic smear of cervix (HGSIL): Secondary | ICD-10-CM

## 2017-10-06 DIAGNOSIS — N6325 Unspecified lump in the left breast, overlapping quadrants: Secondary | ICD-10-CM

## 2017-10-06 DIAGNOSIS — Z1239 Encounter for other screening for malignant neoplasm of breast: Secondary | ICD-10-CM

## 2017-10-06 NOTE — Patient Instructions (Addendum)
Explained breast self awareness with Savannah Perry. Patient did not need a Pap smear today due to last Pap smear and HPV typing was 09/09/2017. Explained the colposcopy the recommended follow-up for her abnormal Pap smear. [facility] for a colposcopy to follow-up for her abnormal Pap smear. Appointment scheduled for Tuesday, October 25, 2017 at 1455. [facility]. Appointment scheduled for Thursday, October 06, 2017 at 0920. Patient aware of appointments and will be there. Discussed smoking cessation with patient. Referred [facility] to the [facility] and gave resources to free smoking cessation classes at [facility]. Savannah BarbanJamie Bean verbalized understanding.  Rmani Kellogg, Kathaleen Maserhristine Poll, RN 10:14 AM

## 2017-10-06 NOTE — Progress Notes (Signed)
[facility] due to having an abnormal Pap smear 09/09/2017 that a colposcopy is recommended for follow-up. Patient complained of two left breast lumps and left breast pain. Patient stated she noticed the first left breast lump 1-2 months ago and the second one 3-4 days ago. Patient states the pain comes and goes. Patient states the pain is worse when the lumps are touched. Patient rates the pain at a 7 out of 10.  Pap Smear: Pap smear not completed today. [facility] with positive HPV. Ref[facility] to [facility] for a colposcopy to follow-up for her abnormal Pap smear. Appointment scheduled for Tuesday, October 25, 2017 at 1455. Per patient has no history of an abnormal Pap smear prior to her most recent Pap smear. Last Pap smear result is in Epic.  Physical exam: Breasts Breasts symmetrical. No skin abnormalities bilateral breasts. No nipple retraction bilateral breasts. No nipple discharge bilateral breasts. No lymphadenopathy. Palpated two lumps within the left breast at 6 o'clock below the nipple within the areola and at a mobile lump at 7 o'clock 4 cm from the nipple. Palpated a bb sized lump within the right breast at 10 o'clock 2 cm from the nipple. Complaints of tenderness when palpated both left breast lumps. [facility] for bilateral breast ultrasounds. Appointment scheduled for Thursday, October 06, 2017 at 0920.        Pelvic/Bimanual No Pap smear completed today since last Pap smear and HPV typing was 09/09/2017. Pap smear not indicated per BCCCP guidelines.   Smoking History: Patient is a current smoker. Discussed smoking cessation with patient. Referred [facility] to the [facility] and gave resources to free smoking cessation classes at [facility].  Patient Navigation: Patient education provided. Access to services provided for patient  through BCCCP program.   Breast and Cervical Cancer Risk Assessment: Patient has no family history of her mother being diagnosed with breast cancer. Patient has no known genetic mutations or no hisstory of radiation treatment to the chest before age 89. Patient has a history of a HGSIL Pap smear and is scheduled for a colposcopy for follow-up. Patient has no history of being immunocompromised or DES exposure in-utero.

## 2017-10-25 ENCOUNTER — Ambulatory Visit (INDEPENDENT_AMBULATORY_CARE_PROVIDER_SITE_OTHER): Payer: No Typology Code available for payment source | Admitting: Obstetrics and Gynecology

## 2017-10-25 ENCOUNTER — Other Ambulatory Visit (HOSPITAL_COMMUNITY)
Admission: RE | Admit: 2017-10-25 | Discharge: 2017-10-25 | Disposition: A | Discharge status: Home / Self Care | Payer: Medicaid Other | Source: Ambulatory Visit | Attending: Obstetrics and Gynecology | Admitting: Obstetrics and Gynecology

## 2017-10-25 ENCOUNTER — Encounter: Payer: Self-pay | Admitting: Obstetrics and Gynecology

## 2017-10-25 VITALS — BP 117/78 | HR 88 | Ht 62.0 in | Wt 110.3 lb

## 2017-10-25 DIAGNOSIS — R87613 High grade squamous intraepithelial lesion on cytologic smear of cervix (HGSIL): Secondary | ICD-10-CM

## 2017-10-25 NOTE — Patient Instructions (Signed)

## 2017-10-25 NOTE — Progress Notes (Signed)
    GYNECOLOGY CLINIC COLPOSCOPY PROCEDURE NOTE  32 y.o. 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 Discussed role for HPV in cervical dysplasia, need for surveillance.  Patient given informed consent, signed copy in the chart, time out was performed.  Placed in lithotomy position. Cervix viewed with speculum and colposcope after application of acetic acid.   Colposcopy adequate? Yes  acetowhite lesion(s) noted from 6 to 12 o'clock; corresponding biopsies obtained.  ECC specimen obtained. All specimens were labeled and sent to pathology.  Complications? None   Patient was given post procedure instructions.  Will follow up pathology and manage accordingly; patient will be contacted with results and recommendations.  Routine preventative health maintenance measures emphasized.   Caryl AdaJazma Kenson Groh, DO OB Fellow Faculty Practice, [facility] 10/25/2017, 3:16 PM

## 2017-10-26 LAB — POCT PREGNANCY, URINE: Preg Test, Ur: NEGATIVE

## 2017-11-07 ENCOUNTER — Encounter: Payer: Self-pay | Admitting: *Deleted

## 2017-12-07 ENCOUNTER — Telehealth: Payer: Self-pay | Admitting: Obstetrics and Gynecology

## 2017-12-07 ENCOUNTER — Telehealth (HOSPITAL_COMMUNITY): Payer: Self-pay | Admitting: *Deleted

## 2017-12-07 NOTE — Telephone Encounter (Signed)
Telephoned patient at home number and patient to fill out BCCCP Medicaid paperwork on Friday April 26 10-11am.

## 2017-12-07 NOTE — Telephone Encounter (Signed)
Called patient to inform her the cryo machine was not working at this time, and we had another office that would be willing to see her. I explained to her how much she would be responsible for, and she would need to make a partial payment. Patient stated that would be fine, and she would call later to get [facility].

## 2017-12-13 ENCOUNTER — Ambulatory Visit: Payer: No Typology Code available for payment source | Admitting: Obstetrics and Gynecology

## 2017-12-14 ENCOUNTER — Encounter: Payer: Self-pay | Admitting: *Deleted

## 2017-12-14 ENCOUNTER — Encounter: Payer: Self-pay | Admitting: Obstetrics and Gynecology

## 2017-12-14 ENCOUNTER — Ambulatory Visit (INDEPENDENT_AMBULATORY_CARE_PROVIDER_SITE_OTHER): Payer: Medicaid Other | Admitting: Obstetrics and Gynecology

## 2017-12-14 DIAGNOSIS — Z3202 Encounter for pregnancy test, result negative: Secondary | ICD-10-CM

## 2017-12-14 DIAGNOSIS — N871 Moderate cervical dysplasia: Secondary | ICD-10-CM | POA: Insufficient documentation

## 2017-12-14 LAB — POCT URINE PREGNANCY: PREG TEST UR: NEGATIVE

## 2017-12-14 NOTE — Progress Notes (Signed)
Presents for [facility] Surgery.     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 She was counseled about that efficacy of this procedure, and possible need for excisional procedure in the future if her cervical dysplasia persists.  The risks of the procedure where explained in detail and patient was told to expect a copious amount of discharge in the next few weeks. All her questions were answered, and written informed consent was obtained.  The patient was placed in the dorsal lithotomy position and a vaginal speculum was placed. Her cervix was visualized and patient was noted to have had normal size transformation zone. The appropriate cryotherapy probe was picked and affixed to cryotherapy apparatus. Then nitrogen gas was then activated, the probe was coated with lubricating jelly and applied to the transformation zone of the cervix. This was kept in place for 3 minutes, thawing period of 1 minute, 3 minutes freezing, thawing period of 1 minute and final one last freezing period of 3 minutes.  The patient tolerated the procedure well without any complications. Routine post procedure instructions were given to the patient.  Follow up appt in 4 weeks   Nettie Elm, MD, FACOG Attending Obstetrician & Gynecologist Center for [facility], [facility]

## 2017-12-14 NOTE — Patient Instructions (Signed)
Cervical cryotherapy is a procedure which involves freezing an area of abnormal tissue on the cervix. This tissue gradually disappears and the cervix heals. One cervical cryotherapy is usually sufficient to destroy the abnormal tissue.  Purpose  Cervical cryotherapy is a standard method used to treat cervical dysplasia, meaning the removal of abnormal cell tissue on the cervix.  Description  Cervical cryotherapy, or freezing, usually lasts about five minutes and causes a slight amount of discomfort. The procedure is usually performed in an outpatient setting.  Cervical cryotherapy is done by placing a small freeze-probe (cryoprobe) against the cervix that cools the cervix to sub-zero temperatures. The cells destroyed by freezing are shed afterwards in a heavy watery discharge. The main advantage of cryotherapy is that it is a simple procedure that requires inexpensive equipment.  The cryogenic device consists of a gas tank containing a refrigerant and non-explosive, non-toxic gas (usually nitrous oxide). The gas is delivered using flexible tubing through a gun-type attachment to the cryoprobe.  Diagnosis/Preparation  Women who undergo cervical cryotherapy typically have had an abnormal Pap smear which has led to a diagnosis of cervical squamous dysplasia and usually confirmed by biopsy after an adequate colposcopic exam.  Preparation for cervical cryotherapy involves scheduling the procedure when the patient is not experiencing heavy menstrual flow. Ibuprofen or naproxen sodium may be given before cryotherapy to decrease cramping. If there is any doubt about the pregnancy status, a pregnancy test is performed.  Aftercare  Cervical cryotherapy is often followed by a heavy and often odorous discharge during the first month after the procedure. The discharge is due to the dead tissue cells leaving the treatment site. The patient should abstain from sexual intercourse and not use tampons for a period of two  weeks after the procedure. Excessive exercise should also be avoided to lessen the occurrence of post-therapy bleeding.  Risks  The following risks have been associated with cervical cryotherapy:  Uterine cramping. Often occurs during the cryotherapy but rapidly subsides after treatment.  Bleeding and infection. Rare, but incidences have been reported.  More difficult Pap smears. Future Pap smears and colposcopy may be more difficult after cryotherapy.  Normal results  A normal result is no recurrence of the abnormal cervix cells. The first follow-up Pap smear is done at 12 months then 24 months after the procedure. If normal, patients resume usual pap smear screening.  If any, recurrences usually occur within two years of treatment.  If a follow-up Pap smear is abnormal, a colposcopy with biopsy is usually performed. Other treatment methods, usually the loop electrocautery excision procedure (LEEP) are then used if persistent disease is discovered.  Following the procedure, it is considered normal to experience the following:  slight cramping for two to three days  watery discharge requiring several pad changes daily  Bloody or brown discharge, especially 12-16 days after the procedure  Alternatives  Loop electrocautery excision procedure (LEEP). This procedure uses a fine wire loop with an electric current flowing through it to remove the desired area of the cervix. Loop excision is usually done under local anesthesia and causes very little discomfort.     

## 2017-12-26 ENCOUNTER — Encounter (HOSPITAL_COMMUNITY): Payer: Self-pay | Admitting: *Deleted

## 2017-12-30 ENCOUNTER — Other Ambulatory Visit (HOSPITAL_COMMUNITY): Payer: Self-pay | Admitting: *Deleted

## 2017-12-30 ENCOUNTER — Other Ambulatory Visit (HOSPITAL_COMMUNITY): Payer: Self-pay | Admitting: Obstetrics and Gynecology

## 2017-12-30 DIAGNOSIS — N6320 Unspecified lump in the left breast, unspecified quadrant: Secondary | ICD-10-CM

## 2018-01-12 ENCOUNTER — Encounter: Payer: Self-pay | Admitting: Obstetrics and Gynecology

## 2018-01-12 ENCOUNTER — Ambulatory Visit (INDEPENDENT_AMBULATORY_CARE_PROVIDER_SITE_OTHER): Payer: Medicaid Other | Admitting: Obstetrics and Gynecology

## 2018-01-12 DIAGNOSIS — Z9889 Other specified postprocedural states: Secondary | ICD-10-CM | POA: Diagnosis not present

## 2018-01-12 NOTE — Progress Notes (Signed)
Patient ID: Savannah Perry, female   DOB: [birth year], 32 y.o.   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 She has no complaints. No vaginal discharge or bleeding. Has had a cycle since procedure   PE AF VSS Lungs clear Heart RR Abd soft + BS  A/P S/P cryosurgery  Doing well. F/U in 1 yr for repeat pap smear

## 2018-01-12 NOTE — Patient Instructions (Signed)

## 2018-01-12 NOTE — Progress Notes (Signed)
Presents for FU to Cryo. Reports no problems

## 2018-01-19 ENCOUNTER — Ambulatory Visit: Payer: No Typology Code available for payment source

## 2018-01-19 ENCOUNTER — Encounter (HOSPITAL_COMMUNITY): Payer: Self-pay | Admitting: *Deleted

## 2018-01-19 ENCOUNTER — Ambulatory Visit (HOSPITAL_COMMUNITY)
Admission: RE | Admit: 2018-01-19 | Discharge: 2018-01-19 | Disposition: A | Discharge status: Home / Self Care | Payer: Self-pay | Source: Ambulatory Visit | Attending: Obstetrics and Gynecology | Admitting: Obstetrics and Gynecology

## 2018-05-20 IMAGING — CR DG CHEST 2V
2 series · 2 of 2 positions shown · non-contrast
Comparison: Chest x-ray 05/17/2017.

CLINICAL DATA: 31-year-old female with history of left-sided chest
pain for the past 6 weeks.

EXAM:
CHEST  2 VIEW

[w chest pa]
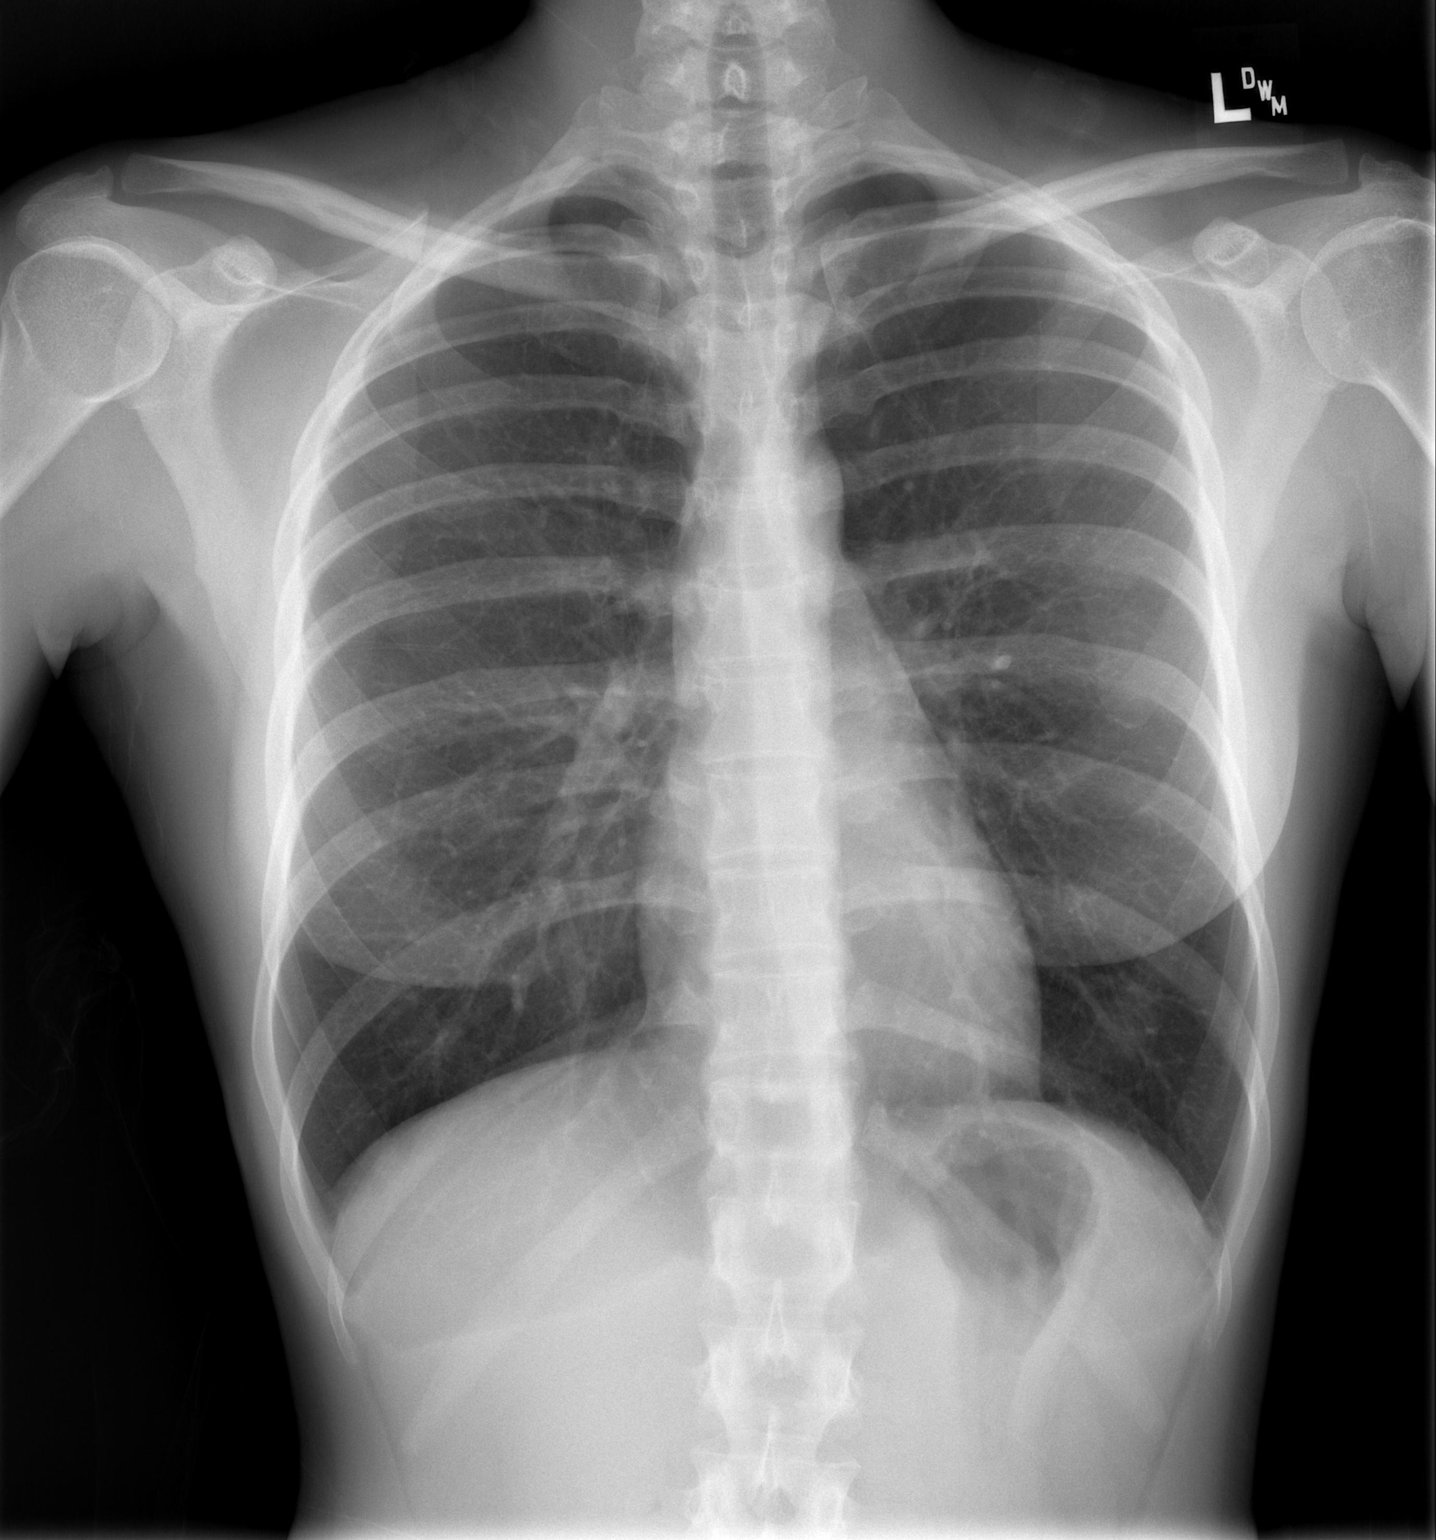

[w chest lat]
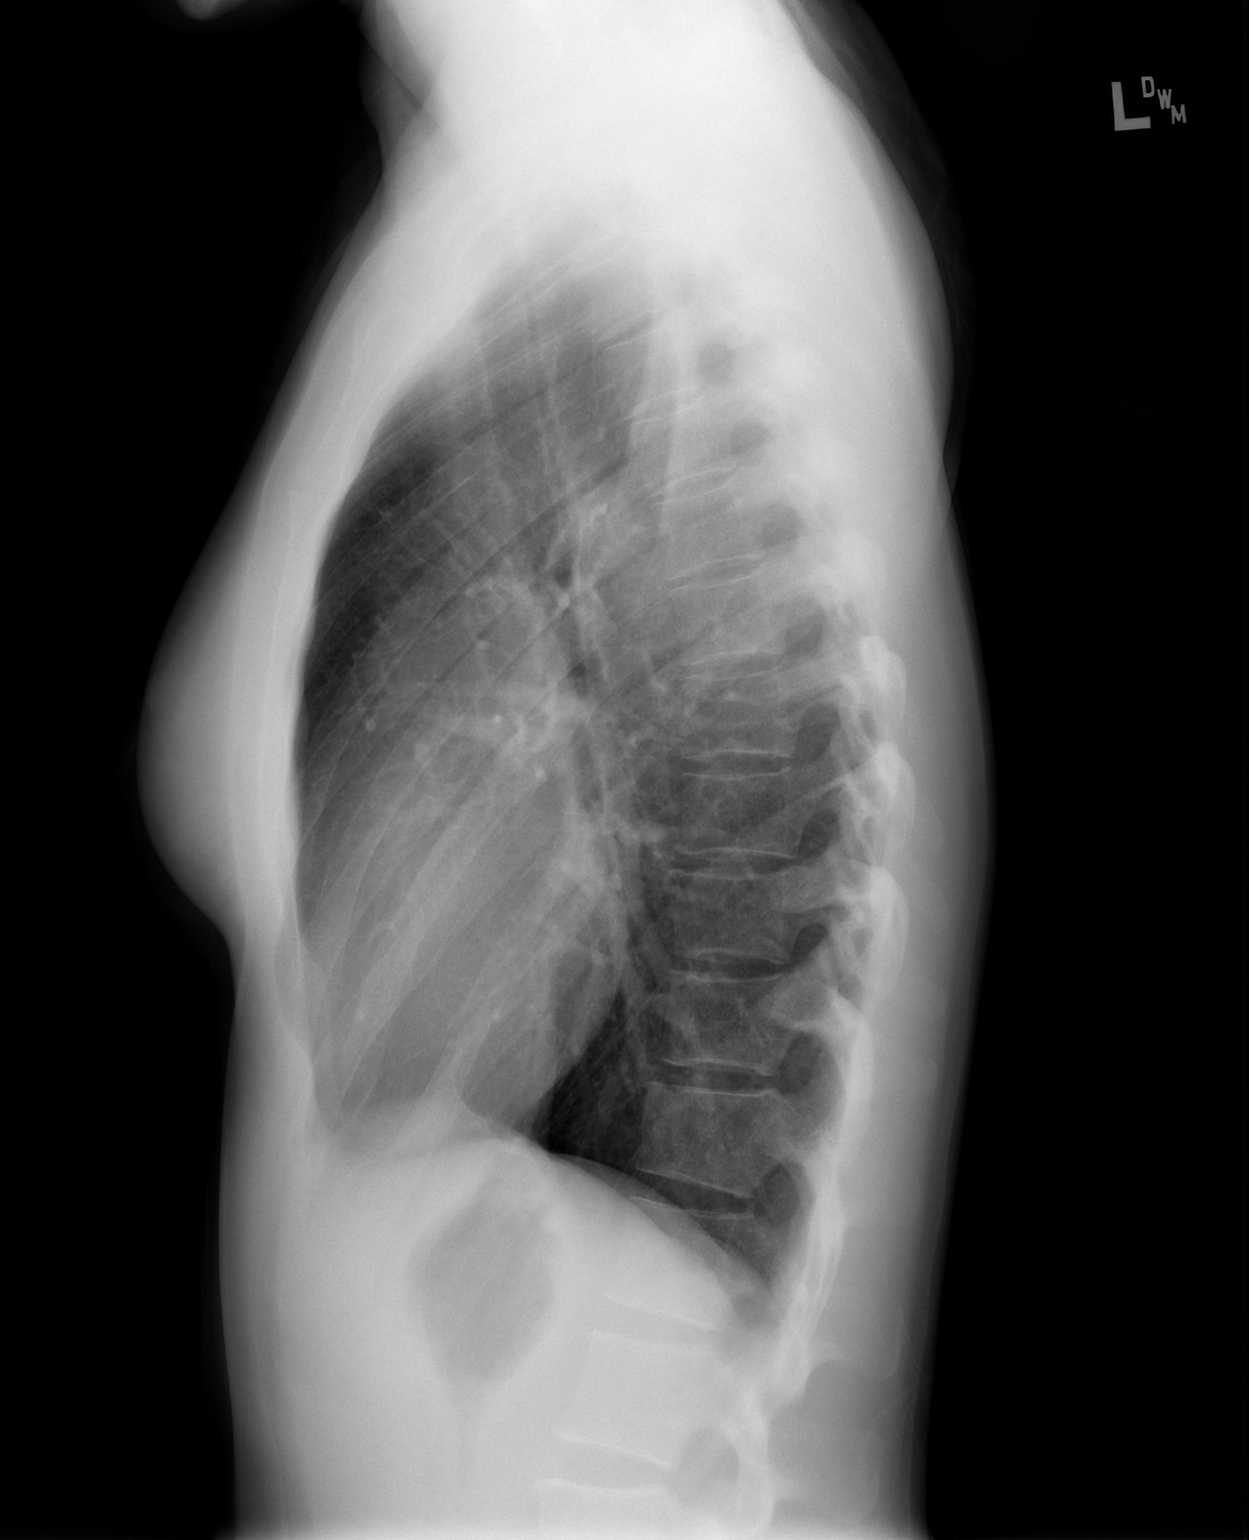

[2 of 2 positions shown; findings below may reference images not displayed]

FINDINGS: Lung volumes are normal. No consolidative airspace disease. No
pleural effusions. No pneumothorax. No pulmonary nodule or mass
noted. Pulmonary vasculature and the cardiomediastinal silhouette
are within normal limits.
IMPRESSION: No radiographic evidence of acute cardiopulmonary disease.

## 2018-05-29 ENCOUNTER — Emergency Department (HOSPITAL_BASED_OUTPATIENT_CLINIC_OR_DEPARTMENT_OTHER)
Admission: EM | Admit: 2018-05-29 | Discharge: 2018-05-29 | Disposition: A | Discharge status: Home / Self Care | Payer: Medicaid Other | Attending: Emergency Medicine | Admitting: Emergency Medicine

## 2018-05-29 ENCOUNTER — Encounter (HOSPITAL_BASED_OUTPATIENT_CLINIC_OR_DEPARTMENT_OTHER): Payer: Self-pay

## 2018-05-29 ENCOUNTER — Other Ambulatory Visit: Payer: Self-pay

## 2018-05-29 DIAGNOSIS — F17210 Nicotine dependence, cigarettes, uncomplicated: Secondary | ICD-10-CM | POA: Insufficient documentation

## 2018-05-29 DIAGNOSIS — F458 Other somatoform disorders: Secondary | ICD-10-CM | POA: Insufficient documentation

## 2018-05-29 DIAGNOSIS — R0989 Other specified symptoms and signs involving the circulatory and respiratory systems: Secondary | ICD-10-CM

## 2018-05-29 MED ORDER — LIDOCAINE VISCOUS HCL 2 % MT SOLN
15.0000 mL | Freq: Once | OROMUCOSAL | Status: AC
Start: 2018-05-29 — End: 2018-05-29
  Administered 2018-05-29: 15 mL via OROMUCOSAL
  Filled 2018-05-29: qty 15

## 2018-05-29 NOTE — ED Triage Notes (Signed)
C/o sore throat and " a knot in my throat" after vomiting x 3weeks-NAD-steady gait

## 2018-05-29 NOTE — Discharge Instructions (Signed)
Follow up with GI.    Try zantac or pepcid twice a day.  Try to avoid things that may make this worse, most commonly these are spicy foods tomato based products fatty foods chocolate and peppermint.  Alcohol and tobacco can also make this worse.  Return to the emergency department for sudden worsening pain fever or inability to eat or drink.

## 2018-05-29 NOTE — ED Provider Notes (Signed)
[facility] EMERGENCY DEPARTMENT Provider Note   CSN: [id number] Arrival date & time: 05/29/18  1439     History   Chief Complaint Chief Complaint  Patient presents with  . Sore Throat    HPI Savannah Perry is a 32 y.o. female.  32 yo F with a chief complaint of feeling like something stuck in the back of her throat.  This been going on for about 3 weeks.  The patient states that she had chicken wings about 3 weeks ago that she got suddenly second vomited.  She felt like a lot came up and that it was very uncomfortable.  She felt that something was stuck in the back on the right side.  She feels that since then it is gotten slightly better but has not gone away.  She denies inability to swallow.  Is been able to eat and drink without difficulty.  Denies cough congestion or fever.  The history is provided by the patient.  Sore Throat  This is a new problem. The current episode started yesterday. The problem occurs constantly. The problem has not changed since onset.Pertinent negatives include no chest pain, no headaches and no shortness of breath. Nothing aggravates the symptoms. Nothing relieves the symptoms. She has tried nothing for the symptoms. The treatment provided no relief.    History reviewed. No pertinent past medical history.  Patient Active Problem List   Diagnosis Date Noted  . History of cryosurgery 01/12/2018  . Cervical dysplasia, moderate 12/14/2017    Past Surgical History:  Procedure Laterality Date  . DILATION AND CURETTAGE OF UTERUS       OB History    Gravida  2   Para      Term      Preterm      AB  2   Living        SAB      TAB  2   Ectopic      Multiple      Live Births               Home Medications    Prior to Admission medications   Not on File    Family History Family History  Problem Relation Age of Onset  . Diabetes Maternal Grandmother     Social History Social History   Tobacco Use  .  Smoking status: Current Every Day Smoker    Packs/day: 0.00    Types: Cigars  . Smokeless tobacco: Never Used  Substance Use Topics  . Alcohol use: Yes    Comment: occ  . Drug use: Yes    Frequency: 7.0 times per week    Types: Marijuana    Comment: occ     Allergies   Amoxicillin and Penicillins cross reactors   Review of Systems Review of Systems  Constitutional: Negative for chills and fever.  HENT: Positive for sore throat. Negative for congestion and rhinorrhea.   Eyes: Negative for redness and visual disturbance.  Respiratory: Negative for shortness of breath and wheezing.   Cardiovascular: Negative for chest pain and palpitations.  Gastrointestinal: Negative for nausea and vomiting.  Genitourinary: Negative for dysuria and urgency.  Musculoskeletal: Negative for arthralgias and myalgias.  Skin: Negative for pallor and wound.  Neurological: Negative for dizziness and headaches.     Physical Exam Updated Vital Signs BP (!) 152/106 (BP Location: Left Arm)   Pulse 80   Temp 98.7 F (37.1 C) (Oral)   Resp 16  Ht 5\' 2"  (1.575 m)   Wt 52.2 kg   LMP 05/07/2018   SpO2 100%   BMI 21.03 kg/m   Physical Exam  Constitutional: She is oriented to person, place, and time. She appears well-developed and well-nourished. No distress.  HENT:  Head: Normocephalic and atraumatic.  Right Ear: Tympanic membrane normal.  Left Ear: Tympanic membrane normal.  No noted tonsillar swelling.  Tolerating secretions without difficulty.  Uvula is midline.    Left anterior lymph node swelling.  Nontender.  Eyes: Pupils are equal, round, and reactive to light. EOM are normal.  Neck: Normal range of motion. Neck supple.  Cardiovascular: Normal rate and regular rhythm. Exam reveals no gallop and no friction rub.  No murmur heard. Pulmonary/Chest: Effort normal. She has no wheezes. She has no rales.  Abdominal: Soft. She exhibits no distension. There is no tenderness.  Musculoskeletal:  She exhibits no edema or tenderness.  Neurological: She is alert and oriented to person, place, and time.  Skin: Skin is warm and dry. She is not diaphoretic.  Psychiatric: She has a normal mood and affect. Her behavior is normal.  Nursing note and vitals reviewed.    ED Treatments / Results  Labs (all labs ordered are listed, but only abnormal results are displayed) Labs Reviewed - No data to display  EKG None  Radiology No results found.  Procedures Procedures (including critical care time)  Medications Ordered in ED Medications  lidocaine (XYLOCAINE) 2 % viscous mouth solution 15 mL (15 mLs Mouth/Throat Given 05/29/18 1504)     Initial Impression / Assessment and Plan / ED Course  I have reviewed the triage vital signs and the nursing notes.  Pertinent labs & imaging results that were available during my care of the patient were reviewed by me and considered in my medical decision making (see chart for details).     32 yo F with a cc of feeling that something is stuck in her throat.  The patient has a very large mouth opening, unable to fully visualize the oropharynx.  There is no signs of obstruction or edema.  Tonsils are normal size.  She is able to swallow freely I will refer her to GI.   3:06 PM:  I have discussed the diagnosis/risks/treatment options with the patient and believe the pt to be eligible for discharge home to follow-up with GI. We also discussed returning to the ED immediately if new or worsening sx occur. We discussed the sx which are most concerning (e.g., sudden worsening pain, fever, inability to tolerate by mouth) that necessitate immediate return. Medications administered to the patient during their visit and any new prescriptions provided to the patient are listed below.  Medications given during this visit Medications  lidocaine (XYLOCAINE) 2 % viscous mouth solution 15 mL (15 mLs Mouth/Throat Given 05/29/18 1504)     The patient appears  reasonably screen and/or stabilized for discharge and I doubt any other medical condition or other [facility] requiring further screening, evaluation, or treatment in the ED at this time prior to discharge.    Final Clinical Impressions(s) / ED Diagnoses   Final diagnoses:  Globus sensation    ED Discharge Orders    None       Melene Plan, DO 05/29/18 1506

## 2021-04-28 ENCOUNTER — Encounter (HOSPITAL_BASED_OUTPATIENT_CLINIC_OR_DEPARTMENT_OTHER): Payer: Self-pay | Admitting: *Deleted

## 2021-04-28 ENCOUNTER — Emergency Department (HOSPITAL_BASED_OUTPATIENT_CLINIC_OR_DEPARTMENT_OTHER): Payer: Medicaid Other

## 2021-04-28 ENCOUNTER — Other Ambulatory Visit: Payer: Self-pay

## 2021-04-28 ENCOUNTER — Emergency Department (HOSPITAL_BASED_OUTPATIENT_CLINIC_OR_DEPARTMENT_OTHER)
Admission: EM | Admit: 2021-04-28 | Discharge: 2021-04-28 | Disposition: A | Discharge status: Home / Self Care | Payer: Medicaid Other | Attending: Emergency Medicine | Admitting: Emergency Medicine

## 2021-04-28 DIAGNOSIS — R079 Chest pain, unspecified: Secondary | ICD-10-CM | POA: Insufficient documentation

## 2021-04-28 DIAGNOSIS — F17210 Nicotine dependence, cigarettes, uncomplicated: Secondary | ICD-10-CM | POA: Diagnosis not present

## 2021-04-28 HISTORY — DX: Anxiety disorder, unspecified: F41.9

## 2021-04-28 LAB — BASIC METABOLIC PANEL
Anion gap: 7 (ref 5–15)
BUN: 6 mg/dL (ref 6–20)
CO2: 22 mmol/L (ref 22–32)
Calcium: 9.1 mg/dL (ref 8.9–10.3)
Chloride: 108 mmol/L (ref 98–111)
Creatinine, Ser: 0.79 mg/dL (ref 0.44–1.00)
GFR, Estimated: >60 mL/min (ref >60)
Glucose, Bld: 101 mg/dL — ABNORMAL HIGH (ref 70–99)
Potassium: 3.7 mmol/L (ref 3.5–5.1)
Sodium: 137 mmol/L (ref 135–145)

## 2021-04-28 LAB — TROPONIN I (HIGH SENSITIVITY)
Troponin I (High Sensitivity): <2 ng/L (ref <18)
Troponin I (High Sensitivity): <2 ng/L (ref <18)

## 2021-04-28 LAB — CBC
HCT: 47 % — ABNORMAL HIGH (ref 36.0–46.0)
Hemoglobin: 16.3 g/dL — ABNORMAL HIGH (ref 12.0–15.0)
MCH: 31.3 pg (ref 26.0–34.0)
MCHC: 34.7 g/dL (ref 30.0–36.0)
MCV: 90.2 fL (ref 80.0–100.0)
Platelets: 275 10*3/uL (ref 150–400)
RBC: 5.21 MIL/uL — ABNORMAL HIGH (ref 3.87–5.11)
RDW: 12.7 % (ref 11.5–15.5)
WBC: 7.1 10*3/uL (ref 4.0–10.5)
nRBC: 0 % (ref 0.0–0.2)

## 2021-04-28 NOTE — ED Provider Notes (Signed)
[facility] EMERGENCY DEPARTMENT Provider Note   CSN: [id number] Arrival date & time: 04/28/21  1757     History Chief Complaint  Patient presents with   Chest Pain    Savannah Perry is a 35 y.o. female.  With past medical history of anxiety presents emergency department with chest pain since March.  Scribes the chest pain as "pinching."  States that it is intermittently sharp, nonradiating.  Aggravated by movement.  Rest makes it better.  Not associated with shortness of breath, nausea or vomiting, diaphoresis, palpitations, lower extremity edema.  States that she was seen in March urgent care for same complaint.  States that she had a EKG and a chest x-ray at that time which was negative.  She believes her chest pain is related to her Nexplanon that she had inserted.  She states that she had this removed in June with improvement in symptoms.  States that yesterday she had increase in her chest pain symptoms that have now improved however they are still present.  She does state that she has anxiety and every time she senses the chest pain she perseverates on it.   Denies hemoptysis, cough, recent travel or surgery, no history of DVT or PE, no lower extremity edema, no C.   Chest Pain Associated symptoms: no cough, no diaphoresis, no dizziness, no fever, no nausea, no palpitations, no shortness of breath and no vomiting    Past Medical History:  Diagnosis Date   Anxiety     Patient Active Problem List   Diagnosis Date Noted   History of cryosurgery 01/12/2018   Cervical dysplasia, moderate 12/14/2017    Past Surgical History:  Procedure Laterality Date   DILATION AND CURETTAGE OF UTERUS       OB History     Gravida  2   Para      Term      Preterm      AB  2   Living         SAB      IAB  2   Ectopic      Multiple      Live Births              Family History  Problem Relation Age of Onset   Diabetes Maternal Grandmother     Social  History   Tobacco Use   Smoking status: Every Day    Packs/day: 0.00    Types: Cigars, Cigarettes   Smokeless tobacco: Never  Vaping Use   Vaping Use: Never used  Substance Use Topics   Alcohol use: Yes    Comment: occ   Drug use: Yes    Frequency: 7.0 times per week    Types: Marijuana    Comment: occ    Home Medications Prior to Admission medications   Not on File    Allergies    Amoxicillin and Penicillins cross reactors  Review of Systems   Review of Systems  Constitutional:  Negative for diaphoresis and fever.  Respiratory:  Negative for cough, chest tightness and shortness of breath.   Cardiovascular:  Positive for chest pain. Negative for palpitations and leg swelling.  Gastrointestinal:  Negative for nausea and vomiting.  Neurological:  Negative for dizziness, syncope and light-headedness.  All other systems reviewed and are negative.  Physical Exam Updated Vital Signs BP (!) 125/91   Pulse 82   Resp 19   Ht 5\' 3"  (1.6 m)   Wt 63.5 kg  LMP 04/16/2021   SpO2 99%   BMI 24.80 kg/m   Physical Exam Vitals and nursing note reviewed.  Constitutional:      General: She is not in acute distress.    Appearance: She is well-developed. She is not ill-appearing.  HENT:     Head: Normocephalic and atraumatic.  Eyes:     General: No scleral icterus.    Extraocular Movements: Extraocular movements intact.     Pupils: Pupils are equal, round, and reactive to light.  Neck:     Vascular: No JVD.  Cardiovascular:     Rate and Rhythm: Normal rate and regular rhythm.     Pulses:          Radial pulses are 2+ on the right side and 2+ on the left side.       Dorsalis pedis pulses are 2+ on the right side and 2+ on the left side.     Heart sounds: Normal heart sounds. No murmur heard. Pulmonary:     Effort: Pulmonary effort is normal. No tachypnea or respiratory distress.     Breath sounds: Normal breath sounds.  Chest:     Chest wall: Tenderness present.   Abdominal:     General: Bowel sounds are normal.     Palpations: Abdomen is soft.  Musculoskeletal:        General: Normal range of motion.     Cervical back: Normal range of motion and neck supple.     Right lower leg: No edema.     Left lower leg: No edema.  Skin:    General: Skin is warm and dry.     Capillary Refill: Capillary refill takes less than 2 seconds.     Findings: No rash.  Neurological:     General: No focal deficit present.     Mental Status: She is alert and oriented to person, place, and time.  Psychiatric:        Mood and Affect: Mood normal.        Behavior: Behavior normal.        Thought Content: Thought content normal.        Judgment: Judgment normal.    ED Results / Procedures / Treatments   Labs (all labs ordered are listed, but only abnormal results are displayed) Labs Reviewed  BASIC METABOLIC PANEL - Abnormal; Notable for the following components:      Result Value   Glucose, Bld 101 (*)    All other components within normal limits  CBC - Abnormal; Notable for the following components:   RBC 5.21 (*)    Hemoglobin 16.3 (*)    HCT 47.0 (*)    All other components within normal limits  PREGNANCY, URINE  TROPONIN I (HIGH SENSITIVITY)  TROPONIN I (HIGH SENSITIVITY)    EKG EKG Interpretation  Date/Time:  Tuesday April 28 2021 18:06:09 EDT Ventricular Rate:  83 PR Interval:  140 QRS Duration: 82 QT Interval:  364 QTC Calculation: 427 R Axis:   90 Text Interpretation: Normal sinus rhythm Rightward axis Borderline ECG No significant change since last tracing Confirmed by Susy Frizzle [phone]) on 04/28/2021 6:12:29 PM  Radiology DG Chest 2 View  Result Date: 04/28/2021 CLINICAL DATA:  Chest pain. EXAM: CHEST - 2 VIEW COMPARISON:  Chest x-ray 10/30/2020. FINDINGS: The heart size and mediastinal contours are within normal limits. Both lungs are clear. The visualized skeletal structures are unremarkable. IMPRESSION: No active  cardiopulmonary disease. Electronically Signed   By: Linton Rump  Malena Edman M.D.   On: 04/28/2021 18:24    Procedures Procedures   Medications Ordered in ED Medications - No data to display  ED Course  I have reviewed the triage vital signs and the nursing notes.  Pertinent labs & imaging results that were available during my care of the patient were reviewed by me and considered in my medical decision making (see chart for details).    MDM Rules/Calculators/A&P Lillion Elbert is a 35 year old female who presents emergency department with chest pain.  History and physical exam as described above. There is no evidence of volume overload to doubt heart failure. EKG without sign of ischemia or infarction, troponin negative.  Doubt ACS Presentation is not consistent with acute PE she is [place]: Low risk. Chest x-ray without evidence of pneumothorax. She has no infectious symptoms so doubt pericarditis, myocarditis.  X-ray without infiltrates so doubt pneumonia Hemoglobin elevated likely due to chronic tobacco use.  Chest pain is most consistent with musculoskeletal pain she describes at times the chest pain can be exacerbated by movement and raising her arms.  I have discussed with her using Tylenol and ibuprofen for anti-inflammatory properties as well as doing gentle range of motion to help improve symptoms. Throughout visit patient is hemodynamically stable.  I have discussed with her the results of all of her work-up and she is agreeable to discharge at this time.  She states that she does not have a primary care provider but she is working on finding one at this time.  Discussed reasons for her to return to emergency department related to her chest pain.  Questions have been answered. Final Clinical Impression(s) / ED Diagnoses Final diagnoses:  Chest pain, unspecified type    Rx / DC Orders ED Discharge Orders     None        Cristopher Peru, PA-C 04/29/21 0026    Pollyann Savoy,  MD 04/29/21 [phone]

## 2021-04-28 NOTE — ED Triage Notes (Signed)
C/o chest pain mid sternal " comes and goes " all day hx of same , no there sx

## 2021-04-28 NOTE — Discharge Instructions (Addendum)
You were seen in the emergency department today for chest pain.  Your labs, EKG, chest x-ray are all reassuring.  We discussed whether your Nexplanon could have anything to do with the chest pain.  And I have instructed you to follow-up with your OB/GYN regarding the insertion and removal of this device.  We also discussed you setting up a primary care provider for you to follow-up as needed.  In the meantime I have given you the information to follow-up with [facility] which is a primary care clinic that you can go to until you are set up with a primary care doctor.  You may also return to the emergency department for any reason

## 2021-08-02 ENCOUNTER — Other Ambulatory Visit: Payer: Self-pay

## 2021-08-02 ENCOUNTER — Encounter (HOSPITAL_BASED_OUTPATIENT_CLINIC_OR_DEPARTMENT_OTHER): Payer: Self-pay

## 2021-08-02 ENCOUNTER — Emergency Department (HOSPITAL_BASED_OUTPATIENT_CLINIC_OR_DEPARTMENT_OTHER)
Admission: EM | Admit: 2021-08-02 | Discharge: 2021-08-02 | Disposition: A | Discharge status: Home / Self Care | Payer: Medicaid Other | Attending: Emergency Medicine | Admitting: Emergency Medicine

## 2021-08-02 DIAGNOSIS — W268XXA Contact with other sharp object(s), not elsewhere classified, initial encounter: Secondary | ICD-10-CM | POA: Diagnosis not present

## 2021-08-02 DIAGNOSIS — F17210 Nicotine dependence, cigarettes, uncomplicated: Secondary | ICD-10-CM | POA: Insufficient documentation

## 2021-08-02 DIAGNOSIS — S0592XA Unspecified injury of left eye and orbit, initial encounter: Secondary | ICD-10-CM | POA: Diagnosis present

## 2021-08-02 DIAGNOSIS — S0532XA Ocular laceration without prolapse or loss of intraocular tissue, left eye, initial encounter: Secondary | ICD-10-CM | POA: Insufficient documentation

## 2021-08-02 MED ORDER — FLUORESCEIN SODIUM 1 MG OP STRP
1.0000 | ORAL_STRIP | Freq: Once | OPHTHALMIC | Status: AC
  Administered 2021-08-02: 10:00:00 1 via OPHTHALMIC
  Filled 2021-08-02: qty 1

## 2021-08-02 MED ORDER — OXYCODONE-ACETAMINOPHEN 5-325 MG PO TABS
1.0000 | ORAL_TABLET | Freq: Once | ORAL | Status: AC
  Administered 2021-08-02: 10:00:00 1 via ORAL
  Filled 2021-08-02: qty 1

## 2021-08-02 MED ORDER — FLUORESCEIN SODIUM 1 MG OP STRP
ORAL_STRIP | OPHTHALMIC | Status: AC
  Administered 2021-08-02: 10:00:00 1 via OPHTHALMIC
  Filled 2021-08-02: qty 1

## 2021-08-02 MED ORDER — TETRACAINE HCL 0.5 % OP SOLN
2.0000 [drp] | Freq: Once | OPHTHALMIC | Status: AC
  Administered 2021-08-02: 10:00:00 2 [drp] via OPHTHALMIC
  Filled 2021-08-02: qty 4

## 2021-08-02 NOTE — Discharge Instructions (Signed)
You have a cut on the clear part of the eye called the cornea that needs evaluation by a specialist today. Please go to Dr. Eliane Decree office at 12:45pm today.

## 2021-08-02 NOTE — ED Triage Notes (Signed)
States was putting insulation in her shed last night and was holding a razorblade and turned and cut eye ball. C/o blurred vision to left eye, swelling to eyelid, tearing. Sent here by UC, said the laceration was "deep".

## 2021-08-02 NOTE — ED Provider Notes (Signed)
[facility] EMERGENCY DEPARTMENT Provider Note   CSN: [id number] Arrival date & time: 08/02/21  2229     History Chief Complaint  Patient presents with   Eye Injury    Savannah Perry is a 35 y.o. female.  Patient presents the emergency department for evaluation of eye injury.  Patient was cutting ceiling insulation last night around 11 PM.  She was holding a razor blade in her left hand and turned her head towards the left.  The blade cut her cornea.  She had immediate pain, tearing.  She denies bleeding.  Since that time she has had swelling of her eyelids and blurry vision.  She presented to urgent care this morning who sent her to the emergency department for further evaluation.  No history of eye problems or injuries.  No other injuries.  No treatments prior to arrival.  Onset of symptoms acute.  Course is constant.      Past Medical History:  Diagnosis Date   Anxiety     Patient Active Problem List   Diagnosis Date Noted   History of cryosurgery 01/12/2018   Cervical dysplasia, moderate 12/14/2017    Past Surgical History:  Procedure Laterality Date   DILATION AND CURETTAGE OF UTERUS       OB History     Gravida  2   Para      Term      Preterm      AB  2   Living         SAB      IAB  2   Ectopic      Multiple      Live Births              Family History  Problem Relation Age of Onset   Diabetes Maternal Grandmother     Social History   Tobacco Use   Smoking status: Every Day    Packs/day: 0.00    Types: Cigars, Cigarettes   Smokeless tobacco: Never  Vaping Use   Vaping Use: Never used  Substance Use Topics   Alcohol use: Yes    Comment: occ   Drug use: Yes    Frequency: 7.0 times per week    Types: Marijuana    Comment: occ    Home Medications Prior to Admission medications   Not on File    Allergies    Amoxicillin and Penicillins cross reactors  Review of Systems   Review of Systems  Constitutional:   Negative for fever.  HENT:  Negative for congestion.   Eyes:  Positive for pain, discharge, redness and visual disturbance (Blurry). Negative for photophobia.  Gastrointestinal:  Negative for nausea and vomiting.   Physical Exam Updated Vital Signs BP (!) 139/94 (BP Location: Right Arm)    Pulse 76    Temp 99.2 F (37.3 C) (Oral)    Resp 18    Ht 5\' 2"  (1.575 m)    Wt 65.8 kg    LMP 07/26/2021 (Approximate)    SpO2 99%    BMI 26.52 kg/m   Physical Exam Vitals and nursing note reviewed.  Constitutional:      Appearance: She is well-developed.  HENT:     Head: Normocephalic and atraumatic.  Eyes:     General: Vision grossly intact.     Extraocular Movements: Extraocular movements intact.     Conjunctiva/sclera:     Right eye: Right conjunctiva is not injected. No exudate or hemorrhage.  Left eye: Left conjunctiva is injected. No exudate or hemorrhage.    Pupils:     Left eye: Pupil is round and reactive. Fluorescein uptake present. Seidel exam negative.    Comments: There is an approximately 3 mm corneal laceration extending from the central axis of the pupil in the direction of the 4-5 o'clock position.  There is some irregular colored debris noted.  Pupil is round.  Iris is round.  No obvious upper welling of aqueous fluid.  Pulmonary:     Effort: No respiratory distress.  Musculoskeletal:     Cervical back: Normal range of motion and neck supple.  Skin:    General: Skin is warm and dry.  Neurological:     Mental Status: She is alert.    ED Results / Procedures / Treatments   Labs (all labs ordered are listed, but only abnormal results are displayed) Labs Reviewed - No data to display  EKG None  Radiology No results found.  Procedures Procedures   Medications Ordered in ED Medications  fluorescein ophthalmic strip 1 strip (1 strip Left Eye Given by Other 08/02/21 1017)  tetracaine (PONTOCAINE) 0.5 % ophthalmic solution 2 drop (2 drops Left Eye Given by Other  08/02/21 1008)  oxyCODONE-acetaminophen (PERCOCET/ROXICET) 5-325 MG per tablet 1 tablet (1 tablet Oral Given 08/02/21 1027)    ED Course  I have reviewed the triage vital signs and the nursing notes.  Pertinent labs & imaging results that were available during my care of the patient were reviewed by me and considered in my medical decision making (see chart for details).  Patient seen and examined.  Tetracaine was applied to the eye with good relief of pain.  Fluorescein was applied and Woods lamp used.  I then used slit-lamp to magnify the cornea for exam.  Negative Seidel on my exam.  I discussed the case with Dr. Jalene Mullet of ophthalmology.  He would like patient to come to his office at 12:45 PM today.  Address obtained.  Eye shield recommended and placed in the emergency department.  I ordered a dose of Percocet for pain.  Updated patient and family on plan.  They are in agreement and will go to the office as planned.  Vital signs reviewed and are as follows: BP (!) 139/94 (BP Location: Right Arm)    Pulse 76    Temp 99.2 F (37.3 C) (Oral)    Resp 18    Ht 5\' 2"  (1.575 m)    Wt 65.8 kg    LMP 07/26/2021 (Approximate)    SpO2 99%    BMI 26.52 kg/m   Patient urged to return with worsening symptoms or other concerns. Patient verbalized understanding and agrees with plan.     Visual Acuity  Right Eye Distance: (S) 20/25 Left Eye Distance: (S) 20/30 Bilateral Distance: (S) 20/20  Right Eye Near:   Left Eye Near:    Bilateral Near:         MDM Rules/Calculators/A&P                          Patient with corneal laceration, do not suspect open globe, but will need urgent ophthalmologic evaluation.  This was arranged as above.      Final Clinical Impression(s) / ED Diagnoses Final diagnoses:  Corneal laceration of left eye, initial encounter    Rx / DC Orders ED Discharge Orders     None  Carlisle Cater, PA-C 08/02/21 1048    Gareth Morgan,  MD 08/03/21 [phone]

## 2021-08-02 NOTE — ED Notes (Signed)
ED Provider at bedside. 

## 2021-08-02 NOTE — ED Notes (Signed)
Eye shield applied.
